# Patient Record
Sex: Male | Born: 1990 | Race: Black or African American | Hispanic: No | Marital: Single | Smoking: Current every day smoker
Health system: Southern US, Community
[De-identification: ages and names within clinical notes are randomized; demographics above are authoritative.]

## PROBLEM LIST (undated history)

## (undated) DIAGNOSIS — J45909 Unspecified asthma, uncomplicated: Secondary | ICD-10-CM

---
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
## 2015-03-10 ENCOUNTER — Emergency Department: Payer: Self-pay | Admitting: Emergency Medicine

## 2016-12-30 ENCOUNTER — Emergency Department: Payer: Self-pay

## 2016-12-30 ENCOUNTER — Emergency Department
Admission: EM | Admit: 2016-12-30 | Discharge: 2016-12-30 | Disposition: A | Discharge status: Home / Self Care | Payer: Self-pay | Attending: Emergency Medicine | Admitting: Emergency Medicine

## 2016-12-30 ENCOUNTER — Encounter: Payer: Self-pay | Admitting: Emergency Medicine

## 2016-12-30 DIAGNOSIS — S46912A Strain of unspecified muscle, fascia and tendon at shoulder and upper arm level, left arm, initial encounter: Secondary | ICD-10-CM | POA: Insufficient documentation

## 2016-12-30 DIAGNOSIS — Y9289 Other specified places as the place of occurrence of the external cause: Secondary | ICD-10-CM | POA: Insufficient documentation

## 2016-12-30 DIAGNOSIS — M7522 Bicipital tendinitis, left shoulder: Secondary | ICD-10-CM | POA: Insufficient documentation

## 2016-12-30 DIAGNOSIS — J45909 Unspecified asthma, uncomplicated: Secondary | ICD-10-CM | POA: Insufficient documentation

## 2016-12-30 DIAGNOSIS — F17210 Nicotine dependence, cigarettes, uncomplicated: Secondary | ICD-10-CM | POA: Insufficient documentation

## 2016-12-30 DIAGNOSIS — W1839XA Other fall on same level, initial encounter: Secondary | ICD-10-CM | POA: Insufficient documentation

## 2016-12-30 DIAGNOSIS — Y9361 Activity, american tackle football: Secondary | ICD-10-CM | POA: Insufficient documentation

## 2016-12-30 DIAGNOSIS — Y999 Unspecified external cause status: Secondary | ICD-10-CM | POA: Insufficient documentation

## 2016-12-30 HISTORY — DX: Unspecified asthma, uncomplicated: J45.909

## 2016-12-30 MED ORDER — CYCLOBENZAPRINE HCL 5 MG PO TABS: 5.0000 mg | ORAL_TABLET | Freq: Three times a day (TID) | ORAL | 0 refills | Status: DC | PRN

## 2016-12-30 NOTE — Discharge Instructions (Signed)
Your exam and x-ray are negative for any fracture or dislocation. You have strained your shoulder due to your football injury. Apply ice and take the prescription muscle relaxant along with your OTC Aleve. Monitor your activities and follow-up with Dr. Joice LoftsPoggi for continued pain and weakness.

## 2016-12-30 NOTE — ED Triage Notes (Signed)
Pt reports he was playing football yesterday when he was tackled and fell on his left shoulder. Decreased ROM and pain to left shoulder.

## 2016-12-30 NOTE — ED Notes (Signed)
See triage note  States he developed pain to left shoulder yesterday  Min swelling noted at clavicle area   Increased pain with ROM

## 2016-12-30 NOTE — ED Provider Notes (Signed)
[facility] Emergency Department Provider Note ____________________________________________  Time seen: 1023  I have reviewed the triage vital signs and the nursing notes.  HISTORY  Chief Complaint  Shoulder Pain  HPI Nathan Sanford is a 26 y.o. male visits to the ED for evaluation of left shoulder pain since yesterday. 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 He describes taken a helmet to the anterior left shoulder as he was tackled to the ground. He reports pain to the shoulder anteriorly but continued to play the game. After that he was over he noted increased pain, and stiffness to the left shoulder. He denies any other injury at this time. He has been without any distal paresthesias, grip changes, or swelling. He reports tightness and pain with range of motion. He denies any recent history of shoulder problems. He gives a remote history of a possible subluxation due to a basketball injury several years prior. No surgeries or chronic and ongoing shoulder pain are reported.He dosed a tablet of Naprosyn yesterday for pain relief.  Past Medical History:  Diagnosis Date  . Asthma     There are no active problems to display for this patient.   History reviewed. No pertinent surgical history.  Prior to Admission medications   Medication Sig Start Date End Date Taking? Authorizing Provider  cyclobenzaprine (FLEXERIL) 5 MG tablet Take 1 tablet (5 mg total) by mouth 3 (three) times daily as needed for muscle spasms. 12/30/16   Charlesetta IvoryJenise V Bacon Brown Dunlap, PA-C    Allergies Patient has no known allergies.  History reviewed. No pertinent family history.  Social History Social History  Substance Use Topics  . Smoking status: Current Every Day Smoker    Packs/day: 0.25    Types: Cigarettes  . Smokeless tobacco: Never Used  . Alcohol use No    Review of Systems  Constitutional: Negative for fever. Cardiovascular:  Negative for chest pain. Respiratory: Negative for shortness of breath. Musculoskeletal: Negative for back pain. Shoulder pain as above. Skin: Negative for rash. Neurological: Negative for headaches, focal weakness or numbness. ____________________________________________  PHYSICAL EXAM:  VITAL SIGNS: ED Triage Vitals [12/30/16 1006]  Enc Vitals Group     BP 129/80     Pulse Rate (!) 56     Resp 18     Temp 97.8 F (36.6 C)     Temp Source Oral     SpO2 99 %     Weight 140 lb (63.5 kg)     Height 5\' 6"  (1.676 m)     Head Circumference      Peak Flow      Pain Score 9     Pain Loc      Pain Edu?      Excl. in GC?    Constitutional: Alert and oriented. Well appearing and in no distress. Head: Normocephalic and atraumatic. Cardiovascular: Normal rate, regular rhythm. Normal distal pulses. Respiratory: Normal respiratory effort. No wheezes/rales/rhonchi. Gastrointestinal: Soft and nontender. No distention. Musculoskeletal: Left shoulder without any obvious deformity, sulcus high, dislocation. Patient with normal active range of motion to the left shoulder. He is able to demonstrate normal internal and external rotation without crepitus or comfort. Normal rotator cuff testing without laxity. Nontender with normal range of motion in all extremities.  Neurologic: Cranial nerves II through XII grossly intact. Normal intrinsic and opposition testing. Normal gait without ataxia. Normal speech and language. No gross focal neurologic deficits are appreciated. Skin:  Skin is warm, dry and intact. No rash noted. ____________________________________________   RADIOLOGY  Left Shoulder IMPRESSION: No acute abnormality.  I, Lillah Standre, Charlesetta Ivory, personally viewed and evaluated these images (plain radiographs) as part of my medical decision making, as well as reviewing the written report by the  radiologist. ____________________________________________  PROCEDURES  sling ____________________________________________  INITIAL IMPRESSION / ASSESSMENT AND PLAN / ED COURSE  Patient with what appears to be a left shoulder strain and potential biceps tendinitis secondary to contusion to the anterior shoulder. No clinical radiologic evidence of acute fracture or dislocation. He'll be discharged with instructions to manage shoulder sprain with ice, anti-inflammatory, and hypertension muscle relaxant. He will follow-up with Dr. Joice Lofts for ongoing symptom management. He is released at this time with activities as tolerated.  Clinical Course    ____________________________________________  FINAL CLINICAL IMPRESSION(S) / ED DIAGNOSES  Final diagnoses:  Strain of left shoulder, initial encounter  Biceps tendinitis of left upper extremity      Lissa Hoard, PA-C 12/30/16 1220    Phineas Semen, MD 12/31/16 1106

## 2017-03-22 ENCOUNTER — Emergency Department: Payer: Self-pay

## 2017-03-22 ENCOUNTER — Encounter: Payer: Self-pay | Admitting: Emergency Medicine

## 2017-03-22 ENCOUNTER — Emergency Department
Admission: EM | Admit: 2017-03-22 | Discharge: 2017-03-22 | Disposition: A | Discharge status: Home / Self Care | Payer: Self-pay | Attending: Emergency Medicine | Admitting: Emergency Medicine

## 2017-03-22 DIAGNOSIS — Y999 Unspecified external cause status: Secondary | ICD-10-CM | POA: Insufficient documentation

## 2017-03-22 DIAGNOSIS — L03032 Cellulitis of left toe: Secondary | ICD-10-CM | POA: Insufficient documentation

## 2017-03-22 DIAGNOSIS — F17210 Nicotine dependence, cigarettes, uncomplicated: Secondary | ICD-10-CM | POA: Insufficient documentation

## 2017-03-22 DIAGNOSIS — J45909 Unspecified asthma, uncomplicated: Secondary | ICD-10-CM | POA: Insufficient documentation

## 2017-03-22 DIAGNOSIS — S60221A Contusion of right hand, initial encounter: Secondary | ICD-10-CM | POA: Insufficient documentation

## 2017-03-22 DIAGNOSIS — W228XXA Striking against or struck by other objects, initial encounter: Secondary | ICD-10-CM | POA: Insufficient documentation

## 2017-03-22 DIAGNOSIS — Y929 Unspecified place or not applicable: Secondary | ICD-10-CM | POA: Insufficient documentation

## 2017-03-22 DIAGNOSIS — Y939 Activity, unspecified: Secondary | ICD-10-CM | POA: Insufficient documentation

## 2017-03-22 MED ORDER — SULFAMETHOXAZOLE-TRIMETHOPRIM 800-160 MG PO TABS: 1.0000 | ORAL_TABLET | Freq: Two times a day (BID) | ORAL | 0 refills | Status: DC

## 2017-03-22 MED ORDER — SULFAMETHOXAZOLE-TRIMETHOPRIM 800-160 MG PO TABS
1.0000 | ORAL_TABLET | Freq: Once | ORAL | Status: AC
  Administered 2017-03-22: 1 via ORAL
  Filled 2017-03-22: qty 1

## 2017-03-22 MED ORDER — TRAMADOL HCL 50 MG PO TABS
50.0000 mg | ORAL_TABLET | Freq: Once | ORAL | Status: AC
  Administered 2017-03-22: 50 mg via ORAL
  Filled 2017-03-22: qty 1

## 2017-03-22 MED ORDER — IBUPROFEN 600 MG PO TABS: 600.0000 mg | ORAL_TABLET | Freq: Four times a day (QID) | ORAL | 0 refills | Status: DC | PRN

## 2017-03-22 MED ORDER — IBUPROFEN 600 MG PO TABS
600.0000 mg | ORAL_TABLET | Freq: Once | ORAL | Status: AC
  Administered 2017-03-22: 600 mg via ORAL
  Filled 2017-03-22: qty 1

## 2017-03-22 MED ORDER — TRAMADOL HCL 50 MG PO TABS: 50.0000 mg | ORAL_TABLET | Freq: Four times a day (QID) | ORAL | 0 refills | Status: DC | PRN

## 2017-03-22 NOTE — ED Notes (Signed)
Pt given snack and water

## 2017-03-22 NOTE — Discharge Instructions (Signed)
Soak your foot in warm Epsom Salt water 4 times per day. Follow up with the primary care provider of your choice for symptoms that are not improving over the next few days. Return to the ER for symptoms that change or worsen if unable to schedule an appointment.

## 2017-03-22 NOTE — ED Provider Notes (Signed)
[facility] Emergency Department Provider Note ____________________________________________  Time seen: Approximately 6:24 PM  I have reviewed the triage vital signs and the nursing notes.   HISTORY  Chief Complaint Hand Pain and Toe Pain    HPI TAKEO HARTS is a 26 y.o. male who presents to the emergency department for evaluation of right hand pain after punching a door yesterday and pain in the left great toe with redness and swelling for the past week.  Past Medical History:  Diagnosis Date  . Asthma     There are no active problems to display for this patient.   History reviewed. No pertinent surgical history.  Prior to Admission medications   Medication Sig Start Date End Date Taking? Authorizing Provider  cyclobenzaprine (FLEXERIL) 5 MG tablet Take 1 tablet (5 mg total) by mouth 3 (three) times daily as needed for muscle spasms. 12/30/16   Jenise V Bacon Menshew, PA-C  ibuprofen (ADVIL,MOTRIN) 600 MG tablet Take 1 tablet (600 mg total) by mouth every 6 (six) hours as needed. 03/22/17   Chinita Pester, FNP  sulfamethoxazole-trimethoprim (BACTRIM DS,SEPTRA DS) 800-160 MG tablet Take 1 tablet by mouth 2 (two) times daily. 03/22/17   Chinita Pester, FNP  traMADol (ULTRAM) 50 MG tablet Take 1 tablet (50 mg total) by mouth every 6 (six) hours as needed. 03/22/17   Chinita Pester, FNP    Allergies Patient has no known allergies.  No family history on file.  Social History Social History  Substance Use Topics  . Smoking status: Current Every Day Smoker    Packs/day: 0.25    Types: Cigarettes  . Smokeless tobacco: Never Used  . Alcohol use No    Review of Systems Constitutional: No recent illness. Cardiovascular: Denies chest pain or palpitations. Respiratory: Denies shortness of breath. Musculoskeletal: Pain in right hand and left great toe. Skin: Negative for rash, wound, lesion. Neurological: Negative for focal weakness or  numbness.  ____________________________________________   PHYSICAL EXAM:  VITAL SIGNS: ED Triage Vitals  Enc Vitals Group     BP 03/22/17 1800 137/70     Pulse Rate 03/22/17 1800 63     Resp 03/22/17 1800 20     Temp 03/22/17 1800 98.5 F (36.9 C)     Temp Source 03/22/17 1800 Oral     SpO2 03/22/17 1800 100 %     Weight 03/22/17 1801 150 lb (68 kg)     Height 03/22/17 1801  (1.702 m)     Head Circumference --      Peak Flow --      Pain Score 03/22/17 1800 10     Pain Loc --      Pain Edu? --      Excl. in GC? --     Constitutional: Alert and oriented. Well appearing and in no acute distress. Eyes: Conjunctivae are normal. EOMI. Head: Atraumatic. Neck: No stridor.  Respiratory: Normal respiratory effort.   Musculoskeletal: Tenderness over the distal 5th metacarpal of the right hand without deformity.  Neurologic:  Normal speech and language. No gross focal neurologic deficits are appreciated. Speech is normal. No gait instability. Skin:  Purulent drainage and fluctuant area at the base of the cuticle on the left great toe. Psychiatric: Mood and affect are normal. Speech and behavior are normal.  ____________________________________________   LABS (all labs ordered are listed, but only abnormal results are displayed)  Labs Reviewed - No data to display ____________________________________________  RADIOLOGY  Right  hand negative for acute bony abnormality per radiologKem Boroughs Shalana Jardin, personally viewed and evaluated these images (plain radiographs) as part of my medical decision making, as well as reviewing the written report by the radiologist.  ___________________________________________   PROCEDURES  Procedure(s) performed: None  ____________________________________________   INITIAL IMPRESSION / ASSESSMENT AND PLAN / ED COURSE  26 year old male presenting to the emergency department for evaluation of right hand pain after punching a wall and  left great toe pain for the past week. Both x-ray and clinical exam of the right hand negative for acute fracture. Left great toe paronychia is spontaneously draining purulent fluid. Patient will be advised to apply ice to his hand. He will be advised to soak his left foot in Epsom salt water. He was advised to follow-up with the primary care provider of his choice for symptoms that are not improving over the next few days. He'll be given prescriptions for tramadol and ibuprofen.  Pertinent labs & imaging results that were available during my care of the patient were reviewed by me and considered in my medical decision making (see chart for details).  _________________________________________   FINAL CLINICAL IMPRESSION(S) / ED DIAGNOSES  Final diagnoses:  Paronychia of great toe, left  Contusion of right hand, initial encounter    New Prescriptions   IBUPROFEN (ADVIL,MOTRIN) 600 MG TABLET    Take 1 tablet (600 mg total) by mouth every 6 (six) hours as needed.   SULFAMETHOXAZOLE-TRIMETHOPRIM (BACTRIM DS,SEPTRA DS) 800-160 MG TABLET    Take 1 tablet by mouth 2 (two) times daily.   TRAMADOL (ULTRAM) 50 MG TABLET    Take 1 tablet (50 mg total) by mouth every 6 (six) hours as needed.    If controlled substance prescribed during this visit, 12 month history viewed on the [facility] prior to issuing an initial prescription for Schedule II or III opiod.    Chinita Pester, FNP 03/22/17 2047    Minna Antis, MD 03/22/17 2229

## 2017-03-22 NOTE — ED Triage Notes (Signed)
Punched door yesterday, pain R hand. Pain great toe L foot with redness and swelling x 1 week.

## 2018-11-26 ENCOUNTER — Emergency Department: Payer: Self-pay

## 2018-11-26 ENCOUNTER — Other Ambulatory Visit: Payer: Self-pay

## 2018-11-26 ENCOUNTER — Emergency Department
Admission: EM | Admit: 2018-11-26 | Discharge: 2018-11-26 | Disposition: A | Discharge status: Home / Self Care | Payer: Self-pay | Attending: Emergency Medicine | Admitting: Emergency Medicine

## 2018-11-26 ENCOUNTER — Encounter: Payer: Self-pay | Admitting: Emergency Medicine

## 2018-11-26 DIAGNOSIS — M25512 Pain in left shoulder: Secondary | ICD-10-CM | POA: Insufficient documentation

## 2018-11-26 DIAGNOSIS — Y990 Civilian activity done for income or pay: Secondary | ICD-10-CM | POA: Insufficient documentation

## 2018-11-26 DIAGNOSIS — J45909 Unspecified asthma, uncomplicated: Secondary | ICD-10-CM | POA: Insufficient documentation

## 2018-11-26 DIAGNOSIS — X500XXA Overexertion from strenuous movement or load, initial encounter: Secondary | ICD-10-CM | POA: Insufficient documentation

## 2018-11-26 DIAGNOSIS — Y9389 Activity, other specified: Secondary | ICD-10-CM | POA: Insufficient documentation

## 2018-11-26 DIAGNOSIS — F17210 Nicotine dependence, cigarettes, uncomplicated: Secondary | ICD-10-CM | POA: Insufficient documentation

## 2018-11-26 DIAGNOSIS — Y92513 Shop (commercial) as the place of occurrence of the external cause: Secondary | ICD-10-CM | POA: Insufficient documentation

## 2018-11-26 NOTE — ED Triage Notes (Signed)
Patient to ER for c/o left shoulder pain. 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 States it has been working since starting there approx one week ago. States pain has worsened since then.

## 2018-11-26 NOTE — ED Provider Notes (Signed)
[facility] Emergency Department Provider Note  Time seen: 9:39 PM  I have reviewed the triage vital signs and the nursing notes.   HISTORY  Chief Complaint Shoulder Injury    HPI Nathan Sanford is a 27 y.o. male with a past medical history of asthma who presents to the emergency department for left shoulder pain.  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  Patient states a history of a prior right shoulder dislocation which felt similar.  However the patient states this time he is able to move his left shoulder but it hurts to do so.  Denies any falls or trauma.  No numbness or tingling.   Past Medical History:  Diagnosis Date  . Asthma     There are no active problems to display for this patient.   History reviewed. No pertinent surgical history.  Prior to Admission medications   Medication Sig Start Date End Date Taking? Authorizing Provider  cyclobenzaprine (FLEXERIL) 5 MG tablet Take 1 tablet (5 mg total) by mouth 3 (three) times daily as needed for muscle spasms. 12/30/16   Menshew, Charlesetta IvoryJenise V Bacon, PA-C  ibuprofen (ADVIL,MOTRIN) 600 MG tablet Take 1 tablet (600 mg total) by mouth every 6 (six) hours as needed. 03/22/17   Triplett, Cari B, FNP  sulfamethoxazole-trimethoprim (BACTRIM DS,SEPTRA DS) 800-160 MG tablet Take 1 tablet by mouth 2 (two) times daily. 03/22/17   Triplett, Cari B, FNP  traMADol (ULTRAM) 50 MG tablet Take 1 tablet (50 mg total) by mouth every 6 (six) hours as needed. 03/22/17   Chinita Pesterriplett, Cari B, FNP    Allergies  Allergen Reactions  . Other     "All fruits": itching in mouth    No family history on file.  Social History Social History   Tobacco Use  . Smoking status: Current Every Day Smoker    Packs/day: 0.25    Types: Cigarettes  . Smokeless tobacco: Never Used  Substance Use Topics  . Alcohol use: No  . Drug use: Not on file    Review  of Systems Constitutional: Negative for fever. Cardiovascular: Negative for chest pain. Respiratory: Negative for shortness of breath. Gastrointestinal: Negative for abdominal pain Genitourinary: Negative for urinary compaints Musculoskeletal: Left shoulder pain, moderate, dull pain worse with movement. Skin: Negative for skin complaints  Neurological: Negative for headache All other ROS negative  ____________________________________________   PHYSICAL EXAM:  VITAL SIGNS: ED Triage Vitals  Enc Vitals Group     BP 11/26/18 2001 128/82     Pulse Rate 11/26/18 2001 (!) 49     Resp 11/26/18 2001 20     Temp 11/26/18 2001 98.4 F (36.9 C)     Temp Source 11/26/18 2001 Oral     SpO2 11/26/18 2001 99 %     Weight 11/26/18 2002 140 lb (63.5 kg)     Height 11/26/18 2002 5\' 7"  (1.702 m)     Head Circumference --      Peak Flow --      Pain Score 11/26/18 2001 9     Pain Loc --      Pain Edu? --      Excl. in GC? --     Constitutional: Alert and oriented. Well appearing and in no distress. Eyes: Normal exam ENT   Head: Normocephalic and atraumatic.   Mouth/Throat: Mucous membranes are moist. Cardiovascular: Normal rate, regular rhythm. No  murmur Respiratory: Normal respiratory effort without tachypnea nor retractions. Breath sounds are clear  Gastrointestinal: Soft and nontender. No distention.   Musculoskeletal: Mild tenderness palpation left shoulder, good range of motion.  Neurovascularly intact.  Moderate pain with range of motion. Neurologic:  Normal speech and language. No gross focal neurologic deficits Skin:  Skin is warm, dry and intact.  Psychiatric: Mood and affect are normal.      RADIOLOGY  X-rays negative  ____________________________________________   INITIAL IMPRESSION / ASSESSMENT AND PLAN / ED COURSE  Pertinent labs & imaging results that were available during my care of the patient were reviewed by me and considered in my medical decision  making (see chart for details).  Patient presents emergency department for left shoulder pain since unloading a truck at work.  Patient has mild tenderness, moderate pain with range of motion although good range of motion, will obtain an x-ray as a precaution.  Patient agreeable to plan of care.  X-rays negative we will discharge from the emergency department with PCP follow-up. ____________________________________________   FINAL CLINICAL IMPRESSION(S) / ED DIAGNOSES  Left shoulder pain    Minna Antis, MD 11/26/18 2155

## 2018-11-26 NOTE — ED Notes (Signed)
Pt ambulatory to POV with family. VSS. NAD. Discharge instructions and follow up reviewed. All questions and concerns addressed.

## 2019-03-19 ENCOUNTER — Emergency Department
Admission: EM | Admit: 2019-03-19 | Discharge: 2019-03-19 | Disposition: A | Discharge status: Home / Self Care | Payer: Self-pay | Attending: Emergency Medicine | Admitting: Emergency Medicine

## 2019-03-19 DIAGNOSIS — F17210 Nicotine dependence, cigarettes, uncomplicated: Secondary | ICD-10-CM | POA: Insufficient documentation

## 2019-03-19 DIAGNOSIS — J069 Acute upper respiratory infection, unspecified: Secondary | ICD-10-CM | POA: Insufficient documentation

## 2019-03-19 DIAGNOSIS — J45909 Unspecified asthma, uncomplicated: Secondary | ICD-10-CM | POA: Insufficient documentation

## 2019-03-19 DIAGNOSIS — R0981 Nasal congestion: Secondary | ICD-10-CM | POA: Insufficient documentation

## 2019-03-19 NOTE — ED Triage Notes (Addendum)
Pt presents to ED via POV with c/o nasal drainage x several days. Pt states hx of seasonal allergies, c/o clear nasal drainage at this time. Pt also c/o cough since last night. Pt A&O x4. NAD noted at this time.

## 2019-03-19 NOTE — ED Notes (Signed)
D/C instructions and self-quarantine instructions reviewed with patient. Pt states understanding. Ambulatory in NAD at this time.

## 2019-03-19 NOTE — ED Provider Notes (Signed)
[facility] Emergency Department Provider Note  ____________________________________________   None    (approximate)  I have reviewed the triage vital signs and the nursing notes.   HISTORY  Chief Complaint Nasal Congestion    HPI Nathan Sanford is a 28 y.o. male presents to the emergency department with URI symptoms.  Is complaining of cough, congestion, denies fever, denies chills, denies chest pain, denies shortness of breath Denies recent travel, eyes close contact with Covid 19+ patient,    Past Medical History:  Diagnosis Date  . Asthma     There are no active problems to display for this patient.   History reviewed. No pertinent surgical history.  Prior to Admission medications   Medication Sig Start Date End Date Taking? Authorizing Provider  cyclobenzaprine (FLEXERIL) 5 MG tablet Take 1 tablet (5 mg total) by mouth 3 (three) times daily as needed for muscle spasms. 12/30/16   Menshew, Charlesetta Ivory, PA-C  ibuprofen (ADVIL,MOTRIN) 600 MG tablet Take 1 tablet (600 mg total) by mouth every 6 (six) hours as needed. 03/22/17   Triplett, Cari B, FNP  sulfamethoxazole-trimethoprim (BACTRIM DS,SEPTRA DS) 800-160 MG tablet Take 1 tablet by mouth 2 (two) times daily. 03/22/17   Triplett, Cari B, FNP  traMADol (ULTRAM) 50 MG tablet Take 1 tablet (50 mg total) by mouth every 6 (six) hours as needed. 03/22/17   Chinita Pester, FNP    Allergies Other  No family history on file.  Social History Social History   Tobacco Use  . Smoking status: Current Every Day Smoker    Packs/day: 0.25    Types: Cigarettes  . Smokeless tobacco: Never Used  Substance Use Topics  . Alcohol use: No  . Drug use: Not on file    Review of Systems  Constitutional: Denies fever/chills Eyes: No visual changes. ENT: Denies sore throat.  He is complaining of nasal drainage and seasonal allergies Respiratory: Complains of cough, but denies shortness of breath  Genitourinary: Negative for dysuria. Musculoskeletal: Negative for back pain. Skin: Negative for rash.    ____________________________________________   PHYSICAL EXAM:  VITAL SIGNS: ED Triage Vitals [03/19/19 1329]  Enc Vitals Group     BP (!) 124/91     Pulse Rate (!) 55     Resp 18     Temp 98.4 F (36.9 C)     Temp Source Oral     SpO2 100 %     Weight 140 lb (63.5 kg)     Height 5\' 7"  (1.702 m)     Head Circumference      Peak Flow      Pain Score 0     Pain Loc      Pain Edu?      Excl. in GC?     Constitutional: Alert and oriented. Well appearing and in no acute distress. Eyes: Conjunctivae are normal.  Head: Atraumatic. Nose: No congestion/rhinnorhea. Mouth/Throat: Mucous membranes are moist.   Neck:  supple no lymphadenopathy noted Cardiovascular: Normal rate, regular rhythm. Heart sounds are normal Respiratory: Normal respiratory effort.  No retractions, lungs clear to auscultation GU: deferred Musculoskeletal: FROM all extremities, warm and well perfused Neurologic:  Normal speech and language.  Skin:  Skin is warm, dry and intact. No rash noted. Psychiatric: Mood and affect are normal. Speech and behavior are normal.  ____________________________________________   LABS (all labs ordered are listed, but only abnormal results are displayed)  Labs Reviewed - No data to display  ____________________________________________   ____________________________________________  RADIOLOGY    ____________________________________________   PROCEDURES  Procedure(s) performed: No  Procedures    ____________________________________________   INITIAL IMPRESSION / ASSESSMENT AND PLAN / ED COURSE  Pertinent labs & imaging results that were available during my care of the patient were reviewed by me and considered in my medical decision making (see chart for details).   Patient is a 28 year old male who complains of URI symptoms.  Basic medical exam  was performed. The patient was instructed to quarantine themselves at home.  Follow-up with your regular doctor if any concerns.  Return emergency department for worsening.     Patient's vital signs are stable.  This patient currently does not meet criteria for testing and I explained that in detail to the patient.  The evaluation today is reassuring with no evidence of emergent medical condition that requires further work-up or evaluation or inpatient treatment.  I provided follow-up recommendations and strict return precautions.  The patient understands and agrees with the plan.  As part of my medical decision making, I reviewed the following data within the electronic MEDICAL RECORD NUMBER Nursing notes reviewed and incorporated, Old chart reviewed, Notes from prior ED visits and [place] Controlled Substance Database  ____________________________________________   FINAL CLINICAL IMPRESSION(S) / ED DIAGNOSES  Final diagnoses:  Acute URI      NEW MEDICATIONS STARTED DURING THIS VISIT:  Discharge Medication List as of 03/19/2019  1:58 PM       Note:  This document was prepared using Dragon voice recognition software and may include unintentional dictation errors.    Faythe Ghee, PA-C 03/19/19 1446    Dionne Bucy, MD 03/19/19 2117

## 2019-08-14 ENCOUNTER — Encounter: Payer: Self-pay | Admitting: Emergency Medicine

## 2019-08-14 ENCOUNTER — Emergency Department
Admission: EM | Admit: 2019-08-14 | Discharge: 2019-08-14 | Disposition: A | Discharge status: Home / Self Care | Payer: Self-pay | Attending: Emergency Medicine | Admitting: Emergency Medicine

## 2019-08-14 ENCOUNTER — Emergency Department: Payer: Self-pay

## 2019-08-14 ENCOUNTER — Other Ambulatory Visit: Payer: Self-pay

## 2019-08-14 DIAGNOSIS — X500XXA Overexertion from strenuous movement or load, initial encounter: Secondary | ICD-10-CM | POA: Insufficient documentation

## 2019-08-14 DIAGNOSIS — F17210 Nicotine dependence, cigarettes, uncomplicated: Secondary | ICD-10-CM | POA: Insufficient documentation

## 2019-08-14 DIAGNOSIS — Y999 Unspecified external cause status: Secondary | ICD-10-CM | POA: Insufficient documentation

## 2019-08-14 DIAGNOSIS — Y9389 Activity, other specified: Secondary | ICD-10-CM | POA: Insufficient documentation

## 2019-08-14 DIAGNOSIS — Y929 Unspecified place or not applicable: Secondary | ICD-10-CM | POA: Insufficient documentation

## 2019-08-14 DIAGNOSIS — S29019A Strain of muscle and tendon of unspecified wall of thorax, initial encounter: Secondary | ICD-10-CM | POA: Insufficient documentation

## 2019-08-14 DIAGNOSIS — M62838 Other muscle spasm: Secondary | ICD-10-CM | POA: Insufficient documentation

## 2019-08-14 DIAGNOSIS — J45909 Unspecified asthma, uncomplicated: Secondary | ICD-10-CM | POA: Insufficient documentation

## 2019-08-14 DIAGNOSIS — Z79899 Other long term (current) drug therapy: Secondary | ICD-10-CM | POA: Insufficient documentation

## 2019-08-14 DIAGNOSIS — T148XXA Other injury of unspecified body region, initial encounter: Secondary | ICD-10-CM

## 2019-08-14 MED ORDER — MELOXICAM 15 MG PO TABS: 15.0000 mg | ORAL_TABLET | Freq: Every day | ORAL | 1 refills | Status: AC

## 2019-08-14 MED ORDER — METHOCARBAMOL 500 MG PO TABS: 500.0000 mg | ORAL_TABLET | Freq: Three times a day (TID) | ORAL | 0 refills | Status: AC | PRN

## 2019-08-14 NOTE — Discharge Instructions (Signed)
Please take meloxicam daily for inflammation. Robaxin can be taken at night before bedtime.  Please do not drive with Robaxin in your system. You can use ice and heat alternating to help with pain. Return to the emergency department with new or worsening symptoms.

## 2019-08-14 NOTE — ED Provider Notes (Signed)
[facility] Emergency Department Provider Note  ____________________________________________  Time seen: Approximately 3:33 PM  I have reviewed the triage vital signs and the nursing notes.   HISTORY  Chief Complaint Shoulder Pain    HPI Nathan Sanford is a 28 y.o. male presents to the emergency department with pain along the right latissimus dorsi and along the right lateral pectoralis major.  Patient states that he has experienced pain for the past 2 days since lifting a heavy bag of fertilizer.  He denies weakness in the arms or legs.  No numbness or tingling in the upper extremities.  He denies chest pain, chest tightness or shortness of breath.  He has not tried any medications at home.        Past Medical History:  Diagnosis Date  . Asthma     There are no active problems to display for this patient.   History reviewed. No pertinent surgical history.  Prior to Admission medications   Medication Sig Start Date End Date Taking? Authorizing Provider  cyclobenzaprine (FLEXERIL) 5 MG tablet Take 1 tablet (5 mg total) by mouth 3 (three) times daily as needed for muscle spasms. 12/30/16   Menshew, Charlesetta IvoryJenise V Bacon, PA-C  ibuprofen (ADVIL,MOTRIN) 600 MG tablet Take 1 tablet (600 mg total) by mouth every 6 (six) hours as needed. 03/22/17   Triplett, Cari B, FNP  sulfamethoxazole-trimethoprim (BACTRIM DS,SEPTRA DS) 800-160 MG tablet Take 1 tablet by mouth 2 (two) times daily. 03/22/17   Triplett, Cari B, FNP  traMADol (ULTRAM) 50 MG tablet Take 1 tablet (50 mg total) by mouth every 6 (six) hours as needed. 03/22/17   Chinita Pesterriplett, Cari B, FNP    Allergies Other  No family history on file.  Social History Social History   Tobacco Use  . Smoking status: Current Every Day Smoker    Packs/day: 0.25    Types: Cigarettes  . Smokeless tobacco: Never Used  Substance Use Topics  . Alcohol use: No  . Drug use: Not on file     Review of Systems   Constitutional: No fever/chills Eyes: No visual changes. No discharge ENT: No upper respiratory complaints. Cardiovascular: no chest pain. Respiratory: no cough. No SOB. Gastrointestinal: No abdominal pain.  No nausea, no vomiting.  No diarrhea.  No constipation. Genitourinary: Negative for dysuria. No hematuria Musculoskeletal: Patient has right-sided latissimus dorsi and pectoralis major pain. Skin: Negative for rash, abrasions, lacerations, ecchymosis. Neurological: Negative for headaches, focal weakness or numbness.   ____________________________________________   PHYSICAL EXAM:  VITAL SIGNS: ED Triage Vitals  Enc Vitals Group     BP 08/14/19 1428 121/77     Pulse Rate 08/14/19 1428 62     Resp 08/14/19 1428 18     Temp 08/14/19 1428 98 F (36.7 C)     Temp Source 08/14/19 1428 Oral     SpO2 08/14/19 1428 99 %     Weight 08/14/19 1429 141 lb 1.5 oz (64 kg)     Height 08/14/19 1429 5\' 7"  (1.702 m)     Head Circumference --      Peak Flow --      Pain Score 08/14/19 1429 10     Pain Loc --      Pain Edu? --      Excl. in [place]? --      Constitutional: Alert and oriented. Well appearing and in no acute distress. Eyes: Conjunctivae are normal. PERRL. EOMI. Head: Atraumatic. Cardiovascular: Normal rate, regular rhythm. Normal  S1 and S2.  Good peripheral circulation. Respiratory: Normal respiratory effort without tachypnea or retractions. Lungs CTAB. Good air entry to the bases with no decreased or absent breath sounds. Musculoskeletal: Patient is able to spread his right fingers and can move all 5 right digits.  He can perform flexion at the right IP joint.  He can perform pronation and supination without difficulty.  He performs full range of motion at the right wrist, right elbow and right shoulder.  He has no tenderness to palpation of the right AC joint.  No right rotator cuff weakness was appreciated with testing.  He had reproducible tenderness with palpation over the  right lateral pectoralis major and the right latissimus dorsi.  Palpable radial pulse, right.  Capillary refill less than 2 seconds right. Neurologic:  Normal speech and language. No gross focal neurologic deficits are appreciated.  Skin:  Skin is warm, dry and intact. No rash noted. Psychiatric: Mood and affect are normal. Speech and behavior are normal. Patient exhibits appropriate insight and judgement.   ____________________________________________   LABS (all labs ordered are listed, but only abnormal results are displayed)  Labs Reviewed - No data to display ____________________________________________  EKG   ____________________________________________  RADIOLOGY I personally viewed and evaluated these images as part of my medical decision making, as well as reviewing the written report by the radiologist.  Dg Shoulder Right  Result Date: 08/14/2019 CLINICAL DATA:  Shoulder pain for several hours following heavy lifting, initial encounter EXAM: RIGHT SHOULDER - 2+ VIEW COMPARISON:  None. FINDINGS: There is no evidence of fracture or dislocation. There is no evidence of arthropathy or other focal bone abnormality. Soft tissues are unremarkable. IMPRESSION: No acute abnormality noted. Electronically Signed   By: Inez Catalina M.D.   On: 08/14/2019 14:57    ____________________________________________    PROCEDURES  Procedure(s) performed:    Procedures    Medications - No data to display   ____________________________________________   INITIAL IMPRESSION / ASSESSMENT AND PLAN / ED COURSE  Pertinent labs & imaging results that were available during my care of the patient were reviewed by me and considered in my medical decision making (see chart for details).  Review of the [place] CSRS was performed in accordance of the [place] prior to dispensing any controlled drugs.           Assessment and plan Muscle strain 28 year old male presents to the emergency  department with pain in the distribution of the right latissimus dorsi and the right pectoralis major.  Vital signs were reassuring at triage.  Patient had reproducible tenderness with palpation over the right latissimus dorsi and the right pectoralis major consistent with muscle spasm/strain.  Patient was discharged with meloxicam and Robaxin.  He was advised to follow-up with primary care as needed and a work note was provided at his request.  All patient questions were answered.    ____________________________________________  FINAL CLINICAL IMPRESSION(S) / ED DIAGNOSES  Final diagnoses:  Muscle strain      NEW MEDICATIONS STARTED DURING THIS VISIT:  ED Discharge Orders    None          This chart was dictated using voice recognition software/Dragon. Despite best efforts to proofread, errors can occur which can change the meaning. Any change was purely unintentional.    Malaquias, Lenker, PA-C 08/14/19 1538    Nance Pear, MD 08/14/19 [phone]

## 2019-08-14 NOTE — ED Triage Notes (Signed)
Pt arrives with pain to right shoulder. Pt reports lifting heavy yesterday for job.

## 2020-01-13 ENCOUNTER — Encounter: Payer: Self-pay | Admitting: Emergency Medicine

## 2020-01-13 ENCOUNTER — Emergency Department
Admission: EM | Admit: 2020-01-13 | Discharge: 2020-01-13 | Disposition: A | Discharge status: Home / Self Care | Payer: Self-pay | Attending: Emergency Medicine | Admitting: Emergency Medicine

## 2020-01-13 ENCOUNTER — Other Ambulatory Visit: Payer: Self-pay

## 2020-01-13 ENCOUNTER — Emergency Department: Payer: Self-pay

## 2020-01-13 DIAGNOSIS — F17210 Nicotine dependence, cigarettes, uncomplicated: Secondary | ICD-10-CM | POA: Insufficient documentation

## 2020-01-13 DIAGNOSIS — S39012A Strain of muscle, fascia and tendon of lower back, initial encounter: Secondary | ICD-10-CM | POA: Insufficient documentation

## 2020-01-13 DIAGNOSIS — Y939 Activity, unspecified: Secondary | ICD-10-CM | POA: Insufficient documentation

## 2020-01-13 DIAGNOSIS — X58XXXA Exposure to other specified factors, initial encounter: Secondary | ICD-10-CM | POA: Insufficient documentation

## 2020-01-13 DIAGNOSIS — J45909 Unspecified asthma, uncomplicated: Secondary | ICD-10-CM | POA: Insufficient documentation

## 2020-01-13 DIAGNOSIS — Y929 Unspecified place or not applicable: Secondary | ICD-10-CM | POA: Insufficient documentation

## 2020-01-13 DIAGNOSIS — Y999 Unspecified external cause status: Secondary | ICD-10-CM | POA: Insufficient documentation

## 2020-01-13 MED ORDER — DIAZEPAM 5 MG PO TABS: 5.0000 mg | ORAL_TABLET | Freq: Three times a day (TID) | ORAL | 0 refills | Status: DC | PRN

## 2020-01-13 MED ORDER — OXYCODONE-ACETAMINOPHEN 5-325 MG PO TABS
2.0000 | ORAL_TABLET | Freq: Once | ORAL | Status: AC
  Administered 2020-01-13: 2 via ORAL
  Filled 2020-01-13: qty 2

## 2020-01-13 MED ORDER — IBUPROFEN 800 MG PO TABS: 800.0000 mg | ORAL_TABLET | Freq: Three times a day (TID) | ORAL | 0 refills | Status: DC | PRN

## 2020-01-13 NOTE — ED Triage Notes (Signed)
Patient reports lower and mid-back pain since waking up on Sunday.  Patient denies any known strain or injury.  Patient does landscaping work.

## 2020-01-13 NOTE — ED Provider Notes (Signed)
[facility] Emergency Department Provider Note       Time seen: ----------------------------------------- 3:01 PM on 01/13/2020 -----------------------------------------   I have reviewed the triage vital signs and the nursing notes.  HISTORY   Chief Complaint Back Pain    HPI Nathan Sanford is a 29 y.o. male with a history of asthma who presents to the ED for low back pain.  Patient states he could not go to work today because of the low back pain.  Pain started after waking up on Sunday.  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  Discomfort is 8 out of 10.  Past Medical History:  Diagnosis Date  . Asthma     There are no problems to display for this patient.   History reviewed. No pertinent surgical history.  Allergies Other  Social History Social History   Tobacco Use  . Smoking status: Current Every Day Smoker    Packs/day: 0.25    Types: Cigarettes  . Smokeless tobacco: Never Used  Substance Use Topics  . Alcohol use: No  . Drug use: Not on file   Review of Systems Constitutional: Negative for fever. Cardiovascular: Negative for chest pain. Respiratory: Negative for shortness of breath. Gastrointestinal: Negative for abdominal pain Musculoskeletal: Positive for back pain Skin: Negative for rash. Neurological: Negative for headaches, focal weakness or numbness.  All systems negative/normal/unremarkable except as stated in the HPI  ____________________________________________   PHYSICAL EXAM:  VITAL SIGNS: ED Triage Vitals  Enc Vitals Group     BP 01/13/20 1403 124/84     Pulse Rate 01/13/20 1403 (!) 56     Resp 01/13/20 1403 16     Temp 01/13/20 1403 98.6 F (37 C)     Temp Source 01/13/20 1403 Oral     SpO2 01/13/20 1403 100 %     Weight 01/13/20 1404 150 lb (68 kg)     Height 01/13/20 1404 5\' 8"  (1.727 m)     Head Circumference --      Peak Flow --      Pain Score 01/13/20 1404 8     Pain Loc --       Pain Edu? --      Excl. in [place]? --    Constitutional: Alert and oriented. Well appearing and in no distress. Cardiovascular: Normal rate, regular rhythm. No murmurs, rubs, or gallops. Respiratory: Normal respiratory effort without tachypnea nor retractions. Breath sounds are clear and equal bilaterally. No wheezes/rales/rhonchi. Musculoskeletal: Nontender with normal range of motion in extremities.  Nonfocal lumbar spine tenderness Neurologic:  Normal speech and language. No gross focal neurologic deficits are appreciated.  Skin:  Skin is warm, dry and intact. No rash noted. Psychiatric: Mood and affect are normal. Speech and behavior are normal.  ____________________________________________  ED COURSE:  As part of my medical decision making, I reviewed the following data within the [place] History obtained from family if available, nursing notes, old chart and ekg, as well as notes from prior ED visits. Patient presented for low back pain, we will assess with labs and imaging as indicated at this time.   Procedures  MYKEL SPONAUGLE was evaluated in Emergency Department on 01/13/2020 for the symptoms described in the history of present illness. He was evaluated in the context of the global COVID-19 pandemic, which necessitated consideration that the patient might be at risk for infection with the SARS-CoV-2 virus that causes COVID-19. Institutional protocols and algorithms that pertain to  the evaluation of patients at risk for COVID-19 are in a state of rapid change based on information released by regulatory bodies including the CDC and federal and state organizations. These policies and algorithms were followed during the patient's care in the ED.  ____________________________________________   RADIOLOGY Images were viewed by me  Lumbar spine x-ray Did not reveal any acute process ____________________________________________   DIFFERENTIAL DIAGNOSIS   Muscle  strain, spasm, fracture  FINAL ASSESSMENT AND PLAN  Lumbosacral strain   Plan: The patient had presented for low back pain. Patient's imaging was reassuring.  Patient be given NSAIDs and muscle relaxants and is cleared for outpatient follow-up.   Ulice Dash, MD    Note: This note was generated in part or whole with voice recognition software. Voice recognition is usually quite accurate but there are transcription errors that can and very often do occur. I apologize for any typographical errors that were not detected and corrected.     Emily Filbert, MD 01/13/20 1537

## 2020-04-05 ENCOUNTER — Encounter: Payer: Self-pay | Admitting: Emergency Medicine

## 2020-04-05 ENCOUNTER — Other Ambulatory Visit: Payer: Self-pay

## 2020-04-05 ENCOUNTER — Emergency Department
Admission: EM | Admit: 2020-04-05 | Discharge: 2020-04-05 | Disposition: A | Discharge status: Home / Self Care | Payer: Self-pay | Attending: Emergency Medicine | Admitting: Emergency Medicine

## 2020-04-05 DIAGNOSIS — K011 Impacted teeth: Secondary | ICD-10-CM | POA: Insufficient documentation

## 2020-04-05 DIAGNOSIS — K029 Dental caries, unspecified: Secondary | ICD-10-CM | POA: Insufficient documentation

## 2020-04-05 DIAGNOSIS — J45909 Unspecified asthma, uncomplicated: Secondary | ICD-10-CM | POA: Insufficient documentation

## 2020-04-05 DIAGNOSIS — F17210 Nicotine dependence, cigarettes, uncomplicated: Secondary | ICD-10-CM | POA: Insufficient documentation

## 2020-04-05 MED ORDER — AMOXICILLIN-POT CLAVULANATE 875-125 MG PO TABS
1.0000 | ORAL_TABLET | Freq: Once | ORAL | Status: AC
  Administered 2020-04-05: 1 via ORAL
  Filled 2020-04-05: qty 1

## 2020-04-05 MED ORDER — AMOXICILLIN-POT CLAVULANATE 875-125 MG PO TABS: 1.0000 | ORAL_TABLET | Freq: Two times a day (BID) | ORAL | 0 refills | Status: AC

## 2020-04-05 NOTE — ED Provider Notes (Signed)
[facility] Emergency Department Provider Note ____________________________________________  Time seen: 1511  I have reviewed the triage vital signs and the nursing notes.  HISTORY  Chief Complaint  Dental Pain  HPI Nathan Sanford is a 29 y.o. male presents to the ED reporting dental infection to the right mandible.  Patient describes similar symptoms last year where he was evaluated by dental provider.  He was unable at that time to have the bad tooth removed.  He denies any interim fever, chills, or sweats.  He does report to some spontaneous purulent drainage into his mouth.  He also admits to dosing 2 leftover amoxicillin pills from his mother.  He is requesting a different antibiotic citing he has been on amoxicillin multiple times in the past but continues to have recurrence of his abscess.  Advised the patient that is likely not a failure of the antibiotic course, but the ongoing tooth defect that is highly prone to abscess formation.  He presents now with mouth pain and jaw pain.  Patient is controlling his oral secretions and is able to eat and drink without difficulty.  Patient denies any other symptoms at this time.   Past Medical History:  Diagnosis Date  . Asthma     There are no problems to display for this patient.   History reviewed. No pertinent surgical history.  Prior to Admission medications   Medication Sig Start Date End Date Taking? Authorizing Provider  amoxicillin-clavulanate (AUGMENTIN) 875-125 MG tablet Take 1 tablet by mouth 2 (two) times daily for 10 days. 04/05/20 04/15/20  Angeles Zehner, Dannielle Karvonen, PA-C  diazepam (VALIUM) 5 MG tablet Take 1 tablet (5 mg total) by mouth every 8 (eight) hours as needed for muscle spasms. 01/13/20   Earleen Newport, MD    Allergies Other  No family history on file.  Social History Social History   Tobacco Use  . Smoking status: Current Every Day Smoker    Packs/day: 0.25    Types: Cigarettes   . Smokeless tobacco: Never Used  Substance Use Topics  . Alcohol use: No  . Drug use: Not on file    Review of Systems  Constitutional: Negative for fever. Eyes: Negative for visual changes. ENT: Negative for sore throat. Dental pain as above Respiratory: Negative for shortness of breath. Gastrointestinal: Negative for abdominal pain, vomiting and diarrhea. Musculoskeletal: Negative for back pain. Skin: Negative for rash. Neurological: Negative for headaches, focal weakness or numbness. ____________________________________________  PHYSICAL EXAM:  VITAL SIGNS: ED Triage Vitals  Enc Vitals Group     BP 04/05/20 1437 126/73     Pulse Rate 04/05/20 1437 89     Resp 04/05/20 1437 18     Temp 04/05/20 1437 99.1 F (37.3 C)     Temp Source 04/05/20 1437 Oral     SpO2 04/05/20 1437 98 %     Weight 04/05/20 1440 149 lb (67.6 kg)     Height 04/05/20 1440 5\' 7"  (1.702 m)     Head Circumference --      Peak Flow --      Pain Score 04/05/20 1439 9     Pain Loc --      Pain Edu? --      Excl. in [place]? --     Constitutional: Alert and oriented. Well appearing and in no distress. Head: Normocephalic and atraumatic. Eyes: Conjunctivae are normal. PERRL. Normal extraocular movements Mouth/Throat: Mucous membranes are moist.  Uvula is midline and tonsils are  flat.  No oropharyngeal lesions are appreciated.  Patient localizes pain to the right third molar.  The molar is partially erupted but no focal gum swelling is appreciated.  The molar has a large buccal cavity and defect extending into the dentin.  No brawny sublingual edema is noted. Neck: Supple. No thyromegaly. Hematological/Lymphatic/Immunological: No cervical lymphadenopathy. Cardiovascular: Normal rate, regular rhythm. Normal distal pulses. Respiratory: Normal respiratory effort. No wheezes/rales/rhonchi. Musculoskeletal: Nontender with normal range of motion in all extremities.  Neurologic:  Normal gait without ataxia.  Normal speech and language. No gross focal neurologic deficits are appreciated. Skin:  Skin is warm, dry and intact. No rash noted. Psychiatric: Mood and affect are normal. Patient exhibits appropriate insight and judgment. ____________________________________________  PROCEDURES  Augmentin 875-125 mg 1 p.o.  Procedures ____________________________________________  INITIAL IMPRESSION / ASSESSMENT AND PLAN / ED COURSE  Patient with ED evaluation of acute pain to the right mandible secondary to a partially erupted third molar.  Patient reports recurrence of dental infection at the same site.  He is requesting antibiotic coverage and has declined penicillin/amoxicillin at this time.  Patient with no reported drug allergies.  We will treat with Augmentin as well as to refer the patient to a dental provider for definitive management.  He is advised that he should avoid eating and chewing on the side of the affected tooth.  He also should rinse after every meal.  He should also consider up a partial/temporary filling to the area to help prevent infection.  A list of dental providers is provided.  Return precautions have been reviewed.  ERMA JOUBERT was evaluated in Emergency Department on 04/05/2020 for the symptoms described in the history of present illness. He was evaluated in the context of the global COVID-19 pandemic, which necessitated consideration that the patient might be at risk for infection with the SARS-CoV-2 virus that causes COVID-19. Institutional protocols and algorithms that pertain to the evaluation of patients at risk for COVID-19 are in a state of rapid change based on information released by regulatory bodies including the CDC and federal and state organizations. These policies and algorithms were followed during the patient's care in the ED. ____________________________________________  FINAL CLINICAL IMPRESSION(S) / ED DIAGNOSES  Final diagnoses:  Pain due to dental caries   Impacted third molar tooth      Lissa Hoard, PA-C 04/05/20 1535    Dionne Bucy, MD 04/05/20 [phone]

## 2020-04-05 NOTE — ED Triage Notes (Signed)
Pt arrived via POV with reports of dental infection on the right last year, pt states he was seen by a dentist a year ago but unable to remove bad tooth.  Pt c/o mouth and neck pain.

## 2020-04-05 NOTE — ED Notes (Signed)
See triage note  Presents with possible dental abscess  States he has had problem with a tooth to right  States he has had problems with same tooth for about 1 year

## 2020-04-05 NOTE — Discharge Instructions (Signed)
Take the antibiotic as directed. Take all of the pills prescribed. Follow-up with one of the local dental provider for dental care and extraction.   OPTIONS FOR DENTAL FOLLOW UP CARE  [place] [facility] and [facility] - Local Safety Net Dental Clinics [email or web address]   [facility] [phone])  [facility] [phone])  [place] [phone])  [facility] [phone])  [facility] [phone]) This clinic caters to the indigent population and is on a lottery system. Location: [employer] [facility], [employer], [address], [place] Clinic Hours: Wednesdays from 6pm - 9pm, patients seen by a lottery system. For dates, call or go to [email or web address] Services: Cleanings, fillings and simple extractions. Payment Options: DENTAL WORK IS FREE OF CHARGE. Bring proof of income or support. Best way to get seen: Arrive at 5:15 pm - this is a lottery, NOT first come/first serve, so arriving earlier will not increase your chances of being seen.     [facility] [phone] Select option 1 for emergencies   Location: [facility], [place], [address] [place] Clinic Hours: No walk-ins accepted - call the day before to schedule an appointment. Check in times are 9:30 am and 1:30 pm. Services: Simple extractions, temporary fillings, pulpectomy/pulp debridement, uncomplicated abscess drainage. Payment Options: PAYMENT IS DUE AT THE TIME OF SERVICE.  Fee is usually $100-200, additional surgical procedures (e.g. abscess drainage) may be extra. Cash, checks, Visa/MasterCard accepted.  Can file Medicaid if patient is covered for dental - patient should call case worker to check. No discount for [facility] patients. Best way to get seen: MUST call the day  before and get onto the schedule. Can usually be seen the next 1-2 days. No walk-ins accepted.     [facility] [phone]   Location: [facility], [address] [place] Clinic Hours: M, W, Th, F 8am or 1:30pm, Tues 9a or 1:30 - first come/first served. Services: Simple extractions, temporary fillings, uncomplicated abscess drainage.  You do not need to be an [facility] resident. Payment Options: PAYMENT IS DUE AT THE TIME OF SERVICE. Dental insurance, otherwise sliding scale - bring proof of income or support. Depending on income and treatment needed, cost is usually $50-200. Best way to get seen: Arrive early as it is first come/first served.     [facility] [phone]   Location: [address] Clinic Hours: Mon-Thu 8a-5p Services: Most basic dental services including extractions and fillings. Payment Options: PAYMENT IS DUE AT THE TIME OF SERVICE. Sliding scale, up to 50% off - bring proof if income or support. Medicaid with dental option accepted. Best way to get seen: Call to schedule an appointment, can usually be seen within 2 weeks OR they will try to see walk-ins - show up at 8a or 2p (you may have to wait).     [facility] [phone] [place] RESIDENTS ONLY   Location: [facility], [address] [place], [place] [address] Clinic Hours: By appointment only. Monday - Thursday 8am-5pm, Friday 8am-12pm Services: Cleanings, fillings, extractions. Payment Options: PAYMENT IS DUE AT THE TIME OF SERVICE. Cash, Visa or MasterCard. Sliding scale - $30 minimum per service. Best way to get seen: Come in to office, complete packet and make an appointment - need proof of income or support monies for each household member and proof of [employer]  [place] residence. Usually takes about a month to get in.     [facility] [phone]   Location: [address], [place] Clinic Hours: Walk-in Urgent Care Dental Services are offered Monday-Friday mornings only. The numbers of emergencies accepted daily is limited to the number of providers available. Maximum 15 - Mondays, Wednesdays & Thursdays Maximum 10 - Tuesdays & Fridays Services: You do not need to be a [facility] resident to be seen for a dental emergency. Emergencies are defined as pain, swelling, abnormal bleeding, or dental trauma. Walkins will receive x-rays if needed. NOTE: Dental cleaning is not an emergency. Payment Options: PAYMENT IS DUE AT THE TIME OF SERVICE. Minimum co-pay is $40.00 for uninsured patients. Minimum co-pay is $3.00 for Medicaid with dental coverage. Dental Insurance is accepted and must be presented at time of visit. Medicare does not cover dental. Forms of payment: Cash, credit card, checks. Best way to get seen: If not previously registered with the clinic, walk-in dental registration begins at 7:15 am and is on a first come/first serve basis. If previously registered with the clinic, call to make an appointment.     [facility] [place] ONLY   Location: [address] [place], [place] Clinic Hours: Mon-Thu 10a-2p Services: Extractions only! Payment Options: FREE (donations accepted) - bring proof of income or support Best way to get seen: Call and schedule an appointment OR come at 8am on the 1st Monday of every month (except for holidays) when it is first come/first served.     [facility] [phone]   Location: [place], [place] Clinic Hours: Friday mornings Services, Payment Options, Best way to get seen: Call for info

## 2020-08-05 ENCOUNTER — Emergency Department
Admission: EM | Admit: 2020-08-05 | Discharge: 2020-08-05 | Disposition: A | Discharge status: Home / Self Care | Payer: Self-pay | Attending: Student in an Organized Health Care Education/Training Program | Admitting: Student in an Organized Health Care Education/Training Program

## 2020-08-05 ENCOUNTER — Other Ambulatory Visit: Payer: Self-pay

## 2020-08-05 DIAGNOSIS — L255 Unspecified contact dermatitis due to plants, except food: Secondary | ICD-10-CM | POA: Insufficient documentation

## 2020-08-05 DIAGNOSIS — F17210 Nicotine dependence, cigarettes, uncomplicated: Secondary | ICD-10-CM | POA: Insufficient documentation

## 2020-08-05 DIAGNOSIS — J45909 Unspecified asthma, uncomplicated: Secondary | ICD-10-CM | POA: Insufficient documentation

## 2020-08-05 MED ORDER — PREDNISONE 10 MG PO TABS: ORAL_TABLET | ORAL | 0 refills | Status: DC

## 2020-08-05 NOTE — ED Triage Notes (Signed)
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

## 2020-08-05 NOTE — ED Notes (Signed)
Pt reports rash to arms, neck and stomach with itching and burning.  Red rash noted.  Reports using lotion with no relief.  Pt in NAD at this time

## 2020-08-05 NOTE — ED Provider Notes (Signed)
[facility] Provider Note  ____________________________________________  Time seen: Approximately 3:22 PM  I have reviewed the triage vital signs and the nursing notes.   HISTORY  Chief Complaint Rash    HPI Nathan Sanford is a 29 y.o. male that presents to the emergency department for evaluation of rash to bilateral forearms, neck, abdomen for 3 days.  Patient was working outside when he developed rash to both arms.  Rash has now spread to the side of his neck and the left side of his abdomen.  Rash itches and burns.  He has been applying calamine lotion but that makes it burn as well.  He is unaware of any contact with poison ivy or poison oak.  He is unsure what he came into contact with.  No bug bites.  No fevers.   Past Medical History:  Diagnosis Date  . Asthma     There are no problems to display for this patient.   No past surgical history on file.  Prior to Admission medications   Medication Sig Start Date End Date Taking? Authorizing Provider  diazepam (VALIUM) 5 MG tablet Take 1 tablet (5 mg total) by mouth every 8 (eight) hours as needed for muscle spasms. 01/13/20   Emily Filbert, MD  predniSONE (DELTASONE) 10 MG tablet Take 6 tablets on day 1, take 5 tablets on day 2, take 4 tablets on day 3, take 3 tablets on day 4, take 2 tablets on day 5, take 1 tablet on day 6 08/05/20   Enid Derry, PA-C    Allergies Other  No family history on file.  Social History Social History   Tobacco Use  . Smoking status: Current Every Day Smoker    Packs/day: 0.25    Types: Cigarettes  . Smokeless tobacco: Never Used  Vaping Use  . Vaping Use: Never used  Substance Use Topics  . Alcohol use: No  . Drug use: Not on file     Review of Systems  Constitutional: No fever/chills Respiratory: No SOB. Gastrointestinal: No nausea, no vomiting.  Musculoskeletal: Negative for musculoskeletal pain. Skin: Negative for  abrasions, lacerations, ecchymosis. Positive for rash. Neurological: Negative for headaches   ____________________________________________   PHYSICAL EXAM:  VITAL SIGNS: ED Triage Vitals  Enc Vitals Group     BP 08/05/20 1341 118/84     Pulse Rate 08/05/20 1341 66     Resp 08/05/20 1341 16     Temp 08/05/20 1341 98.4 F (36.9 C)     Temp Source 08/05/20 1341 Oral     SpO2 --      Weight 08/05/20 1342 137 lb 5.6 oz (62.3 kg)     Height 08/05/20 1342 5\' 8"  (1.727 m)     Head Circumference --      Peak Flow --      Pain Score 08/05/20 1342 9     Pain Loc --      Pain Edu? --      Excl. in GC? --      Constitutional: Alert and oriented. Well appearing and in no acute distress. Eyes: Conjunctivae are normal. PERRL. EOMI. Head: Atraumatic. ENT:      Ears:      Nose: No congestion/rhinnorhea.      Mouth/Throat: Mucous membranes are moist.  Neck: No stridor.  Cardiovascular: Normal rate, regular rhythm.  Good peripheral circulation. Respiratory: Normal respiratory effort without tachypnea or retractions. Lungs CTAB. Good air entry to the bases with  no decreased or absent breath sounds. Musculoskeletal: Full range of motion to all extremities. No gross deformities appreciated. Neurologic:  Normal speech and language. No gross focal neurologic deficits are appreciated.  Skin:  Skin is warm, dry. Few vesicles to bilateral forearms, right side of neck and left lower abdomen.  Psychiatric: Mood and affect are normal. Speech and behavior are normal. Patient exhibits appropriate insight and judgement.   ____________________________________________   LABS (all labs ordered are listed, but only abnormal results are displayed)  Labs Reviewed - No data to display ____________________________________________  EKG   ____________________________________________  RADIOLOGY   No results found.  ____________________________________________    PROCEDURES  Procedure(s)  performed:    Procedures    Medications - No data to display   ____________________________________________   INITIAL IMPRESSION / ASSESSMENT AND PLAN / ED COURSE  Pertinent labs & imaging results that were available during my care of the patient were reviewed by me and considered in my medical decision making (see chart for details).  Review of the [place] CSRS was performed in accordance of the [facility] prior to dispensing any controlled drugs.  Patient's diagnosis is consistent with contact dermatitis. Patient will be discharged home with prescriptions for prednisone. Patient is to follow up with PCP as directed. Patient is given ED precautions to return to the ED for any worsening or new symptoms.  Nathan Sanford was evaluated in Emergency Department on 08/05/2020 for the symptoms described in the history of present illness. He was evaluated in the context of the global COVID-19 pandemic, which necessitated consideration that the patient might be at risk for infection with the SARS-CoV-2 virus that causes COVID-19. Institutional protocols and algorithms that pertain to the evaluation of patients at risk for COVID-19 are in a state of rapid change based on information released by regulatory bodies including the CDC and federal and state organizations. These policies and algorithms were followed during the patient's care in the ED.   ____________________________________________  FINAL CLINICAL IMPRESSION(S) / ED DIAGNOSES  Final diagnoses:  Contact dermatitis due to plants, except food, unspecified contact dermatitis type      NEW MEDICATIONS STARTED DURING THIS VISIT:  ED Discharge Orders         Ordered    predniSONE (DELTASONE) 10 MG tablet     Discontinue  Reprint     08/05/20 1526              This chart was dictated using voice recognition software/Dragon. Despite best efforts to proofread, errors can occur which can change the meaning. Any change was purely  unintentional.    Enid Derry, PA-C 08/05/20 1644    Willy Eddy, MD 08/06/20 1540

## 2020-10-16 IMAGING — CR RIGHT SHOULDER - 2+ VIEW
1 series · 3 of 3 positions shown · non-contrast
Comparison: None.

CLINICAL DATA: Shoulder pain for several hours following heavy
lifting, initial encounter

EXAM:
RIGHT SHOULDER - 2+ VIEW

[Series 1: dg shoulder right · 0.14mm/px · 3 of 3 slices shown]
[im 1/3]
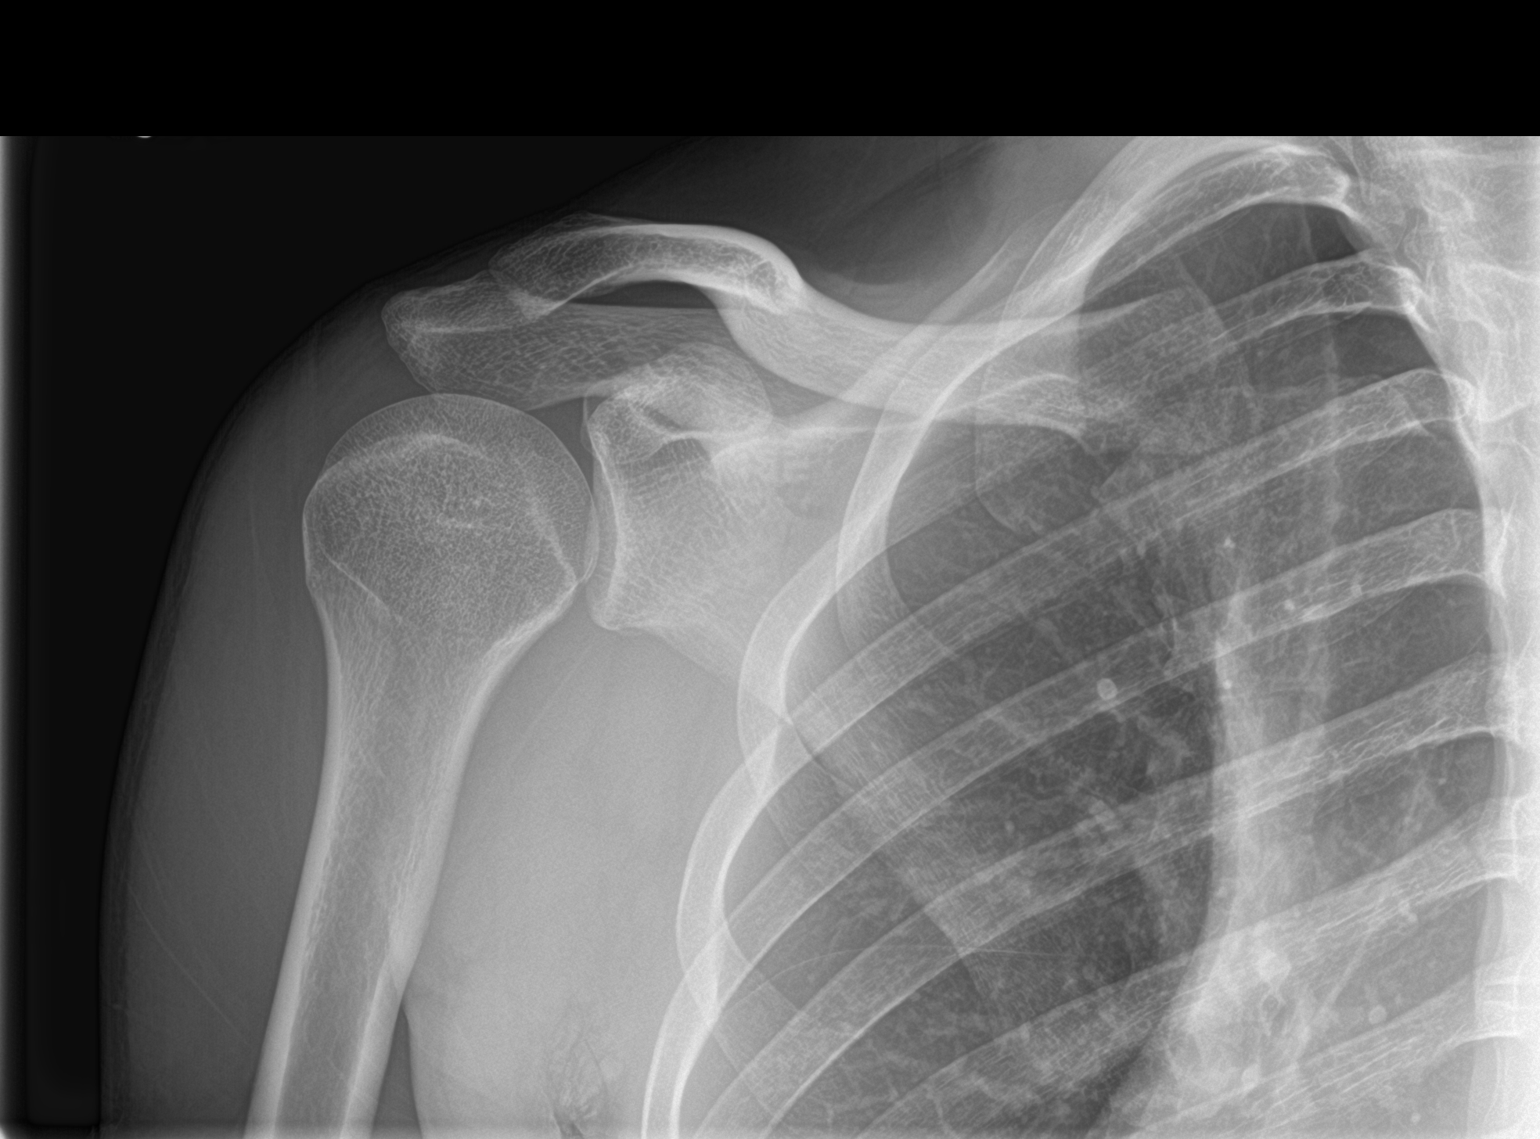
[im 2/3]
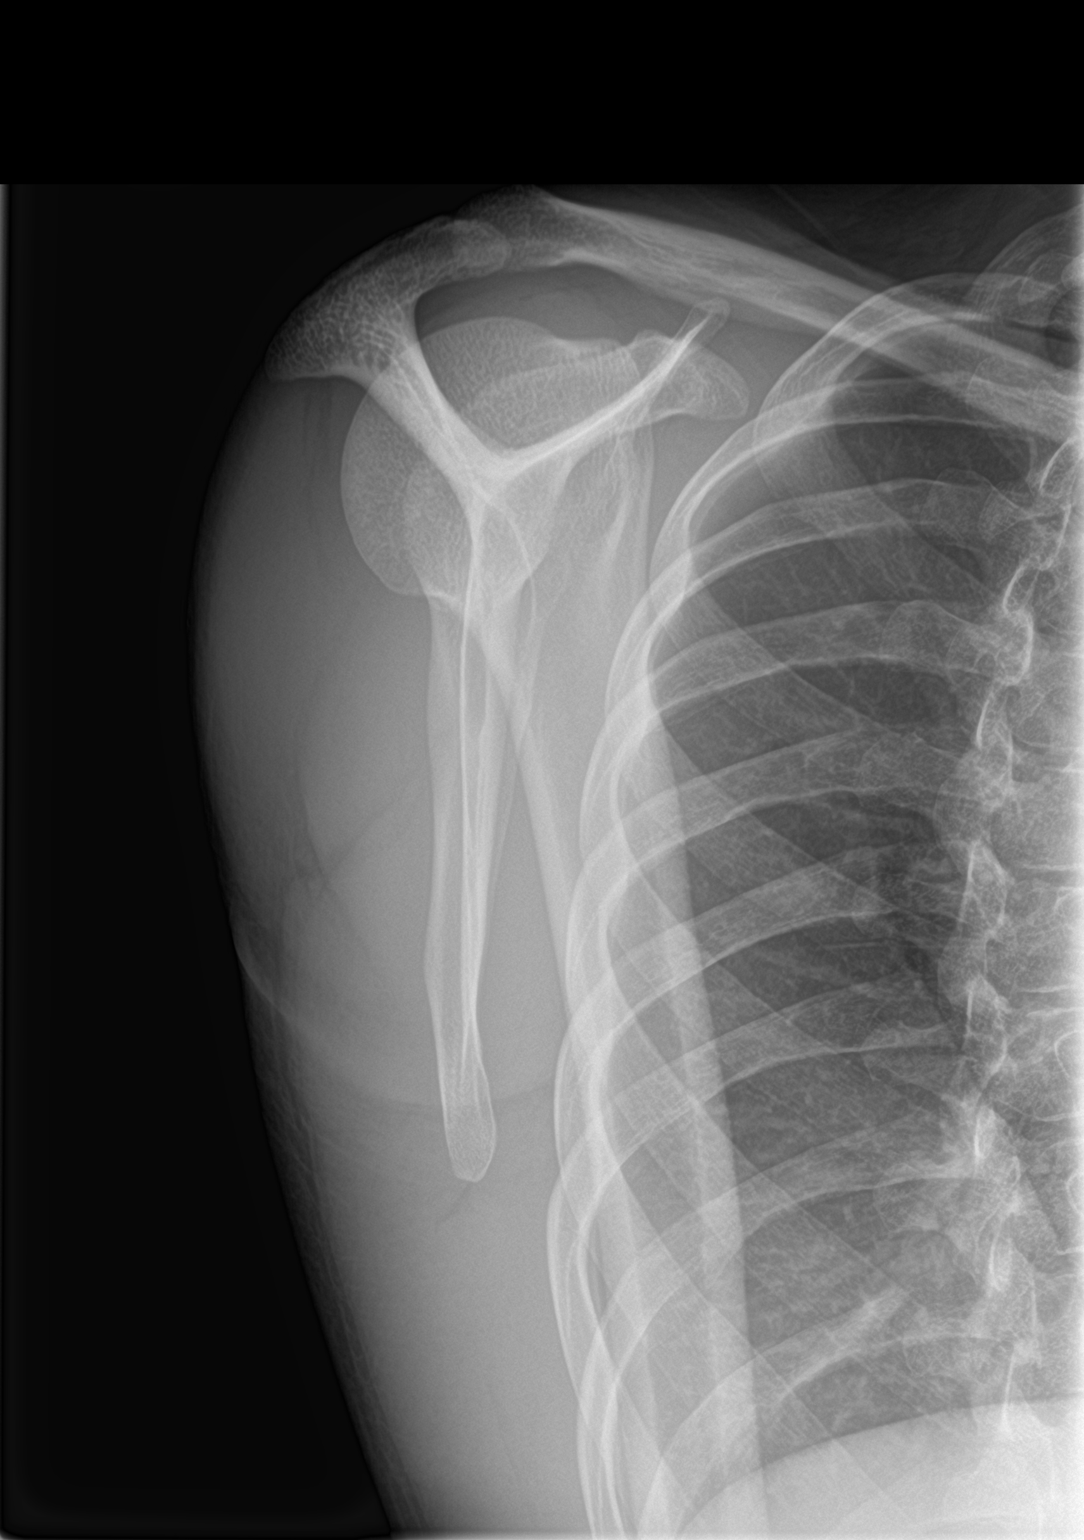
[im 3/3]
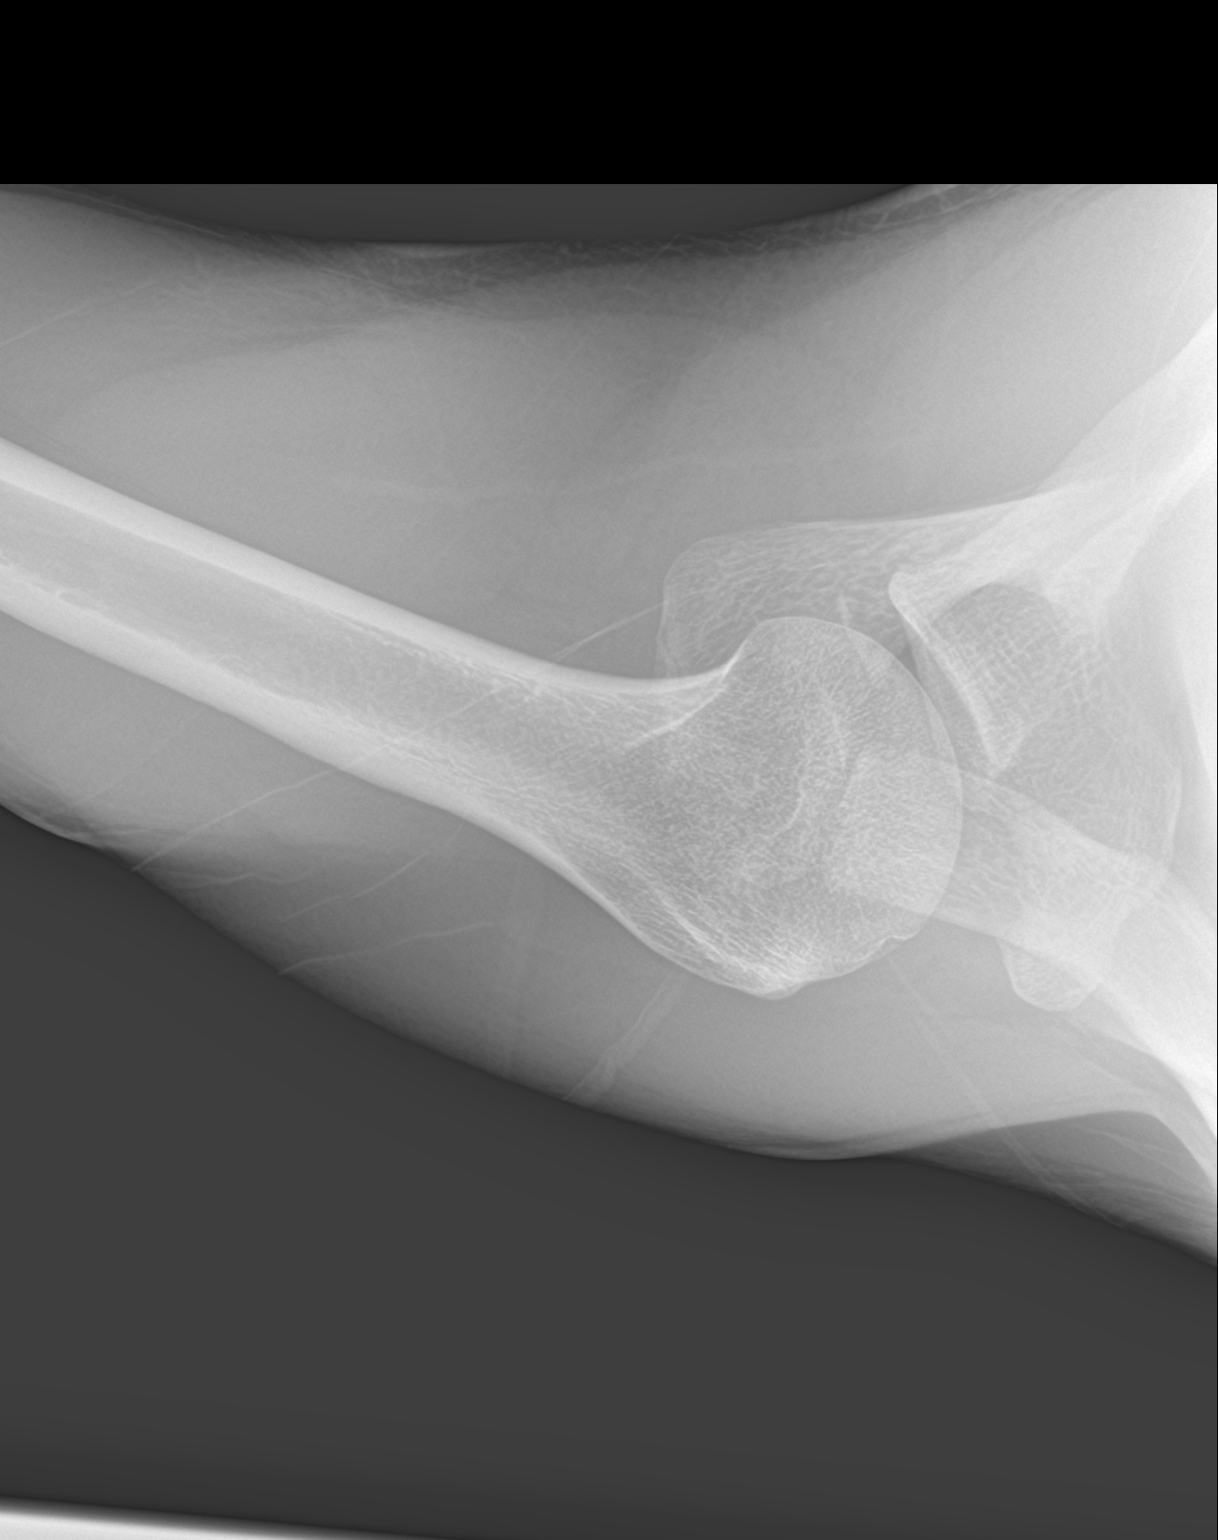

[3 of 3 positions shown; findings below may reference images not displayed]

FINDINGS: There is no evidence of fracture or dislocation. There is no
evidence of arthropathy or other focal bone abnormality. Soft
tissues are unremarkable.
IMPRESSION: No acute abnormality noted.

## 2021-01-17 ENCOUNTER — Other Ambulatory Visit: Payer: Self-pay

## 2021-01-17 ENCOUNTER — Emergency Department
Admission: EM | Admit: 2021-01-17 | Discharge: 2021-01-20 | Disposition: A | Discharge status: Home / Self Care | Payer: Self-pay | Attending: Emergency Medicine | Admitting: Emergency Medicine

## 2021-01-17 DIAGNOSIS — Z5321 Procedure and treatment not carried out due to patient leaving prior to being seen by health care provider: Secondary | ICD-10-CM | POA: Insufficient documentation

## 2021-01-17 DIAGNOSIS — M549 Dorsalgia, unspecified: Secondary | ICD-10-CM | POA: Insufficient documentation

## 2021-01-17 NOTE — ED Triage Notes (Signed)
Pt c/o back pain x 2 weeks. Denies injury or falling. A&O, ambulatory. No distress noted.

## 2021-03-17 IMAGING — CR DG LUMBAR SPINE 2-3V
1 series · 3 of 3 positions shown · non-contrast
Comparison: None.

CLINICAL DATA: Acute low back pain without known injury

EXAM:
LUMBAR SPINE - 2-3 VIEW

[Series 1: dg lumbar spine 2-3 views · 0.14mm/px · 3 of 3 slices shown]
[im 1/3]
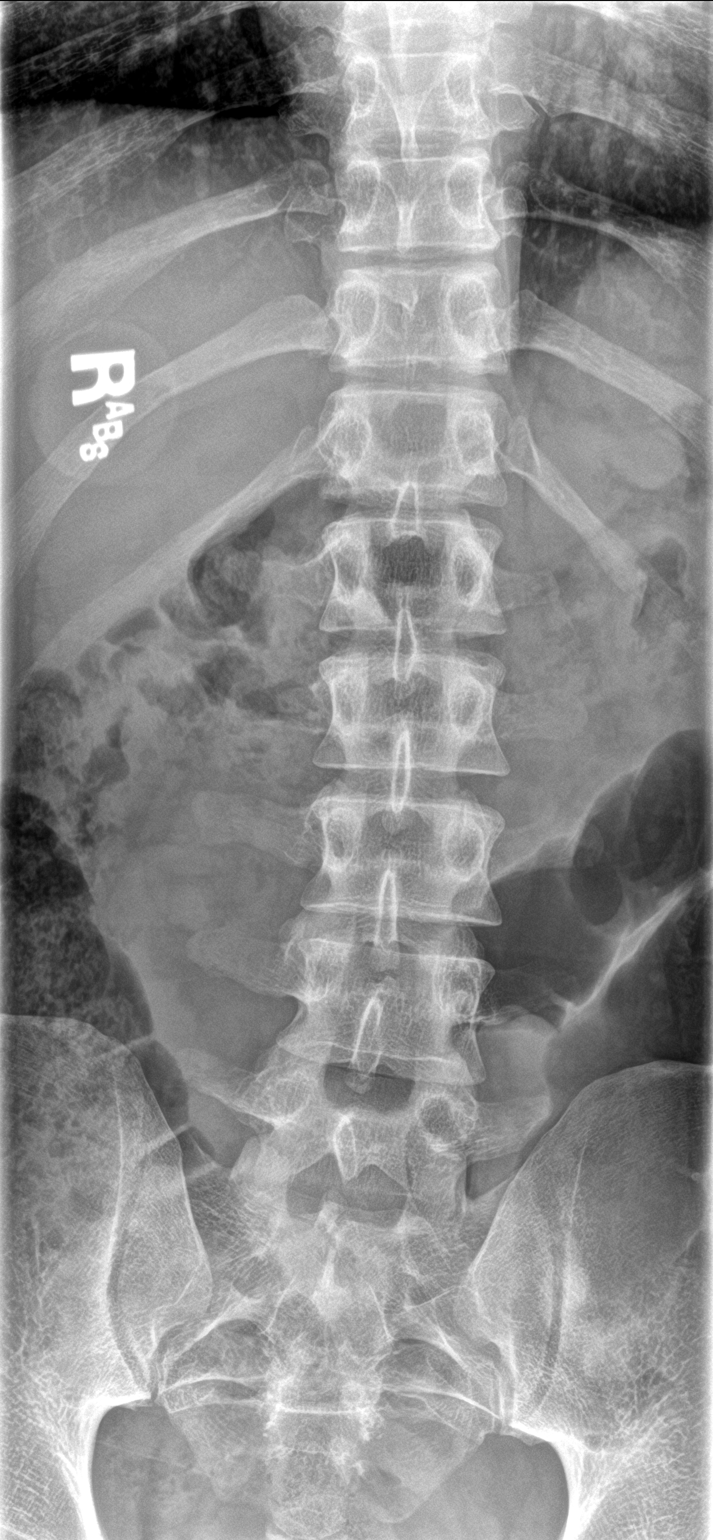
[im 2/3]
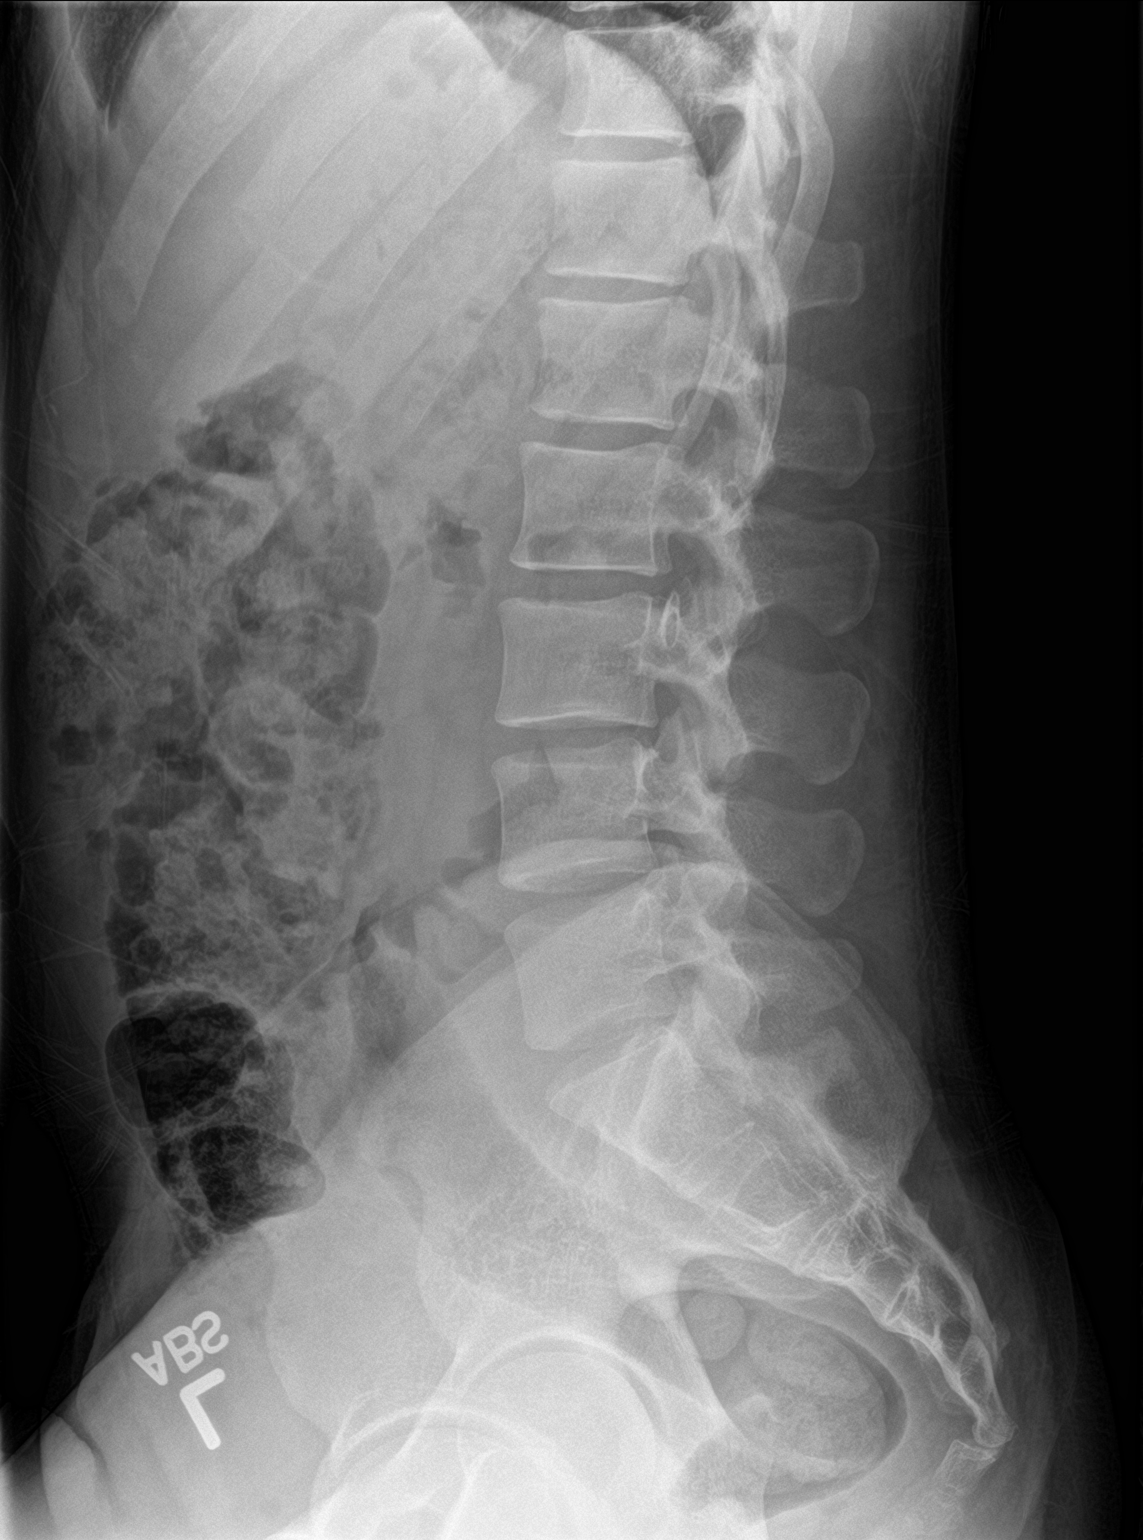
[im 3/3]
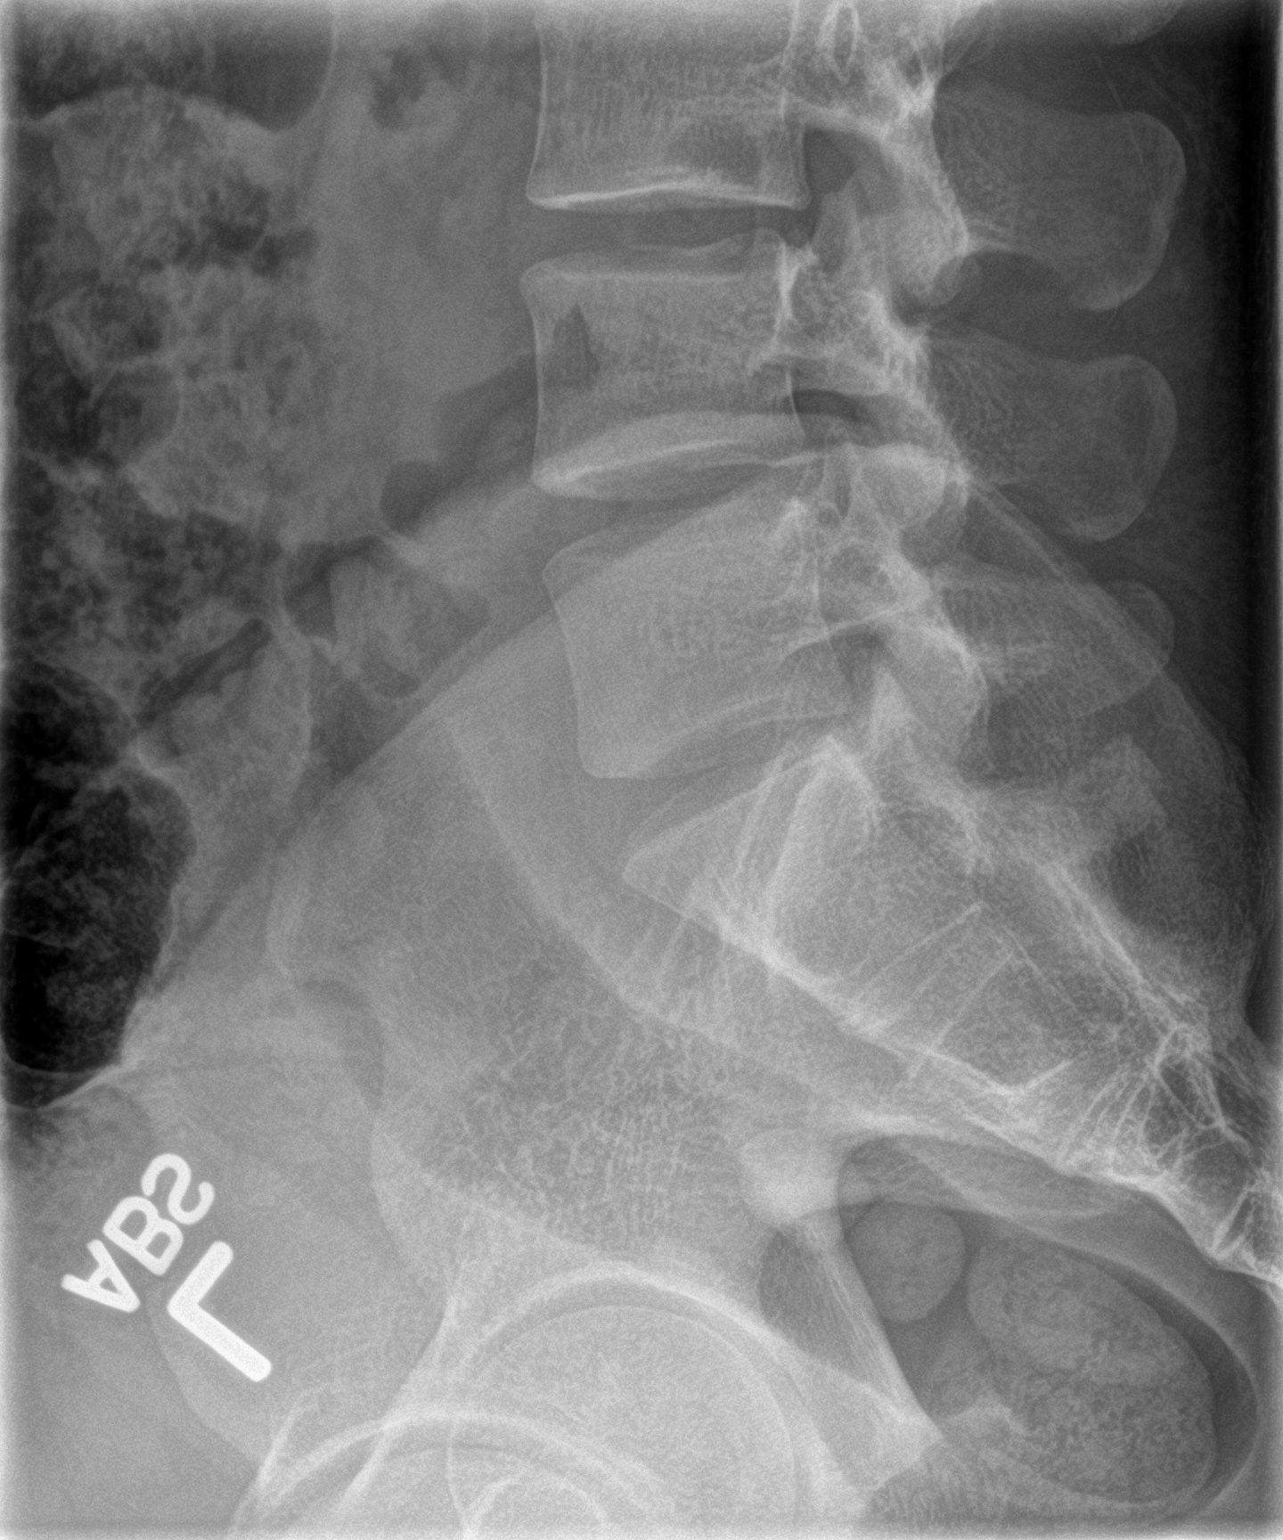

[3 of 3 positions shown; findings below may reference images not displayed]

FINDINGS: There is no evidence of lumbar spine fracture. Alignment is normal.
Intervertebral disc spaces are maintained.
IMPRESSION: Negative.

## 2021-05-17 ENCOUNTER — Encounter: Payer: Self-pay | Admitting: Emergency Medicine

## 2021-05-17 ENCOUNTER — Emergency Department: Payer: Self-pay

## 2021-05-17 ENCOUNTER — Other Ambulatory Visit: Payer: Self-pay

## 2021-05-17 ENCOUNTER — Emergency Department
Admission: EM | Admit: 2021-05-17 | Discharge: 2021-05-17 | Disposition: A | Discharge status: Home / Self Care | Payer: Self-pay | Attending: Emergency Medicine | Admitting: Emergency Medicine

## 2021-05-17 DIAGNOSIS — J45909 Unspecified asthma, uncomplicated: Secondary | ICD-10-CM | POA: Insufficient documentation

## 2021-05-17 DIAGNOSIS — Z91010 Allergy to peanuts: Secondary | ICD-10-CM | POA: Insufficient documentation

## 2021-05-17 DIAGNOSIS — U071 COVID-19: Secondary | ICD-10-CM | POA: Insufficient documentation

## 2021-05-17 DIAGNOSIS — B349 Viral infection, unspecified: Secondary | ICD-10-CM | POA: Insufficient documentation

## 2021-05-17 DIAGNOSIS — F17210 Nicotine dependence, cigarettes, uncomplicated: Secondary | ICD-10-CM | POA: Insufficient documentation

## 2021-05-17 LAB — RESP PANEL BY RT-PCR (FLU A&B, COVID) ARPGX2
Influenza A by PCR: NEGATIVE
Influenza B by PCR: NEGATIVE
SARS Coronavirus 2 by RT PCR: POSITIVE — AB

## 2021-05-17 MED ORDER — IBUPROFEN 600 MG PO TABS
600.0000 mg | ORAL_TABLET | Freq: Once | ORAL | Status: AC
  Administered 2021-05-17: 600 mg via ORAL
  Filled 2021-05-17: qty 1

## 2021-05-17 NOTE — ED Provider Notes (Signed)
[facility]EMERGENCY DEPARTMENT  ____________________________________________  Time seen: Approximately 10:30 PM  I have reviewed the triage vital signs and the nursing notes.   HISTORY  Chief Complaint Fever and Generalized Body Aches   Historian Patient    HPI Nathan Sanford is a 30 y.o. male presents to the emergency department with fever, body aches, cough and chills.  Patient has had symptoms for the past 2 to 3 days.  No chest pain, chest tightness or abdominal pain.  No vomiting or diarrhea.  No recent travel.  No changes in stooling or urinary output.   Past Medical History:  Diagnosis Date  . Asthma      Immunizations up to date:  Yes.     Past Medical History:  Diagnosis Date  . Asthma     There are no problems to display for this patient.   History reviewed. No pertinent surgical history.  Prior to Admission medications   Medication Sig Start Date End Date Taking? Authorizing Provider  diazepam (VALIUM) 5 MG tablet Take 1 tablet (5 mg total) by mouth every 8 (eight) hours as needed for muscle spasms. 01/13/20   Emily Filbert, MD  predniSONE (DELTASONE) 10 MG tablet Take 6 tablets on day 1, take 5 tablets on day 2, take 4 tablets on day 3, take 3 tablets on day 4, take 2 tablets on day 5, take 1 tablet on day 6 08/05/20   Enid Derry, PA-C    Allergies Peanut-containing drug products and Other  History reviewed. No pertinent family history.  Social History Social History   Tobacco Use  . Smoking status: Current Every Day Smoker    Packs/day: 0.25    Types: Cigarettes  . Smokeless tobacco: Never Used  Vaping Use  . Vaping Use: Never used  Substance Use Topics  . Alcohol use: No     Review of Systems  Constitutional: Patient has fever.  Eyes:  No discharge ENT: No upper respiratory complaints. Respiratory: Patient has cough. No SOB/ use of accessory muscles to breath Gastrointestinal:   No nausea, no vomiting.  No diarrhea.  No  constipation. Musculoskeletal: Negative for musculoskeletal pain. Skin: Negative for rash, abrasions, lacerations, ecchymosis.    ____________________________________________   PHYSICAL EXAM:  VITAL SIGNS: ED Triage Vitals [05/17/21 1923]  Enc Vitals Group     BP 117/66     Pulse Rate 98     Resp 16     Temp (!) 101.4 F (38.6 C)     Temp Source Oral     SpO2 97 %     Weight 150 lb (68 kg)     Height 5\' 7"  (1.702 m)     Head Circumference      Peak Flow      Pain Score 8     Pain Loc      Pain Edu?      Excl. in GC?      Constitutional: Alert and oriented. Patient is lying supine. Eyes: Conjunctivae are normal. PERRL. EOMI. Head: Atraumatic. ENT:      Ears: Tympanic membranes are mildly injected with mild effusion bilaterally.       Nose: No congestion/rhinnorhea.      Mouth/Throat: Mucous membranes are moist. Posterior pharynx is mildly erythematous.  Hematological/Lymphatic/Immunilogical: No cervical lymphadenopathy.  Cardiovascular: Normal rate, regular rhythm. Normal S1 and S2.  Good peripheral circulation. Respiratory: Normal respiratory effort without tachypnea or retractions. Lungs CTAB. Good air entry to the bases with no decreased or absent  breath sounds. Gastrointestinal: Bowel sounds 4 quadrants. Soft and nontender to palpation. No guarding or rigidity. No palpable masses. No distention. No CVA tenderness. Musculoskeletal: Full range of motion to all extremities. No gross deformities appreciated. Neurologic:  Normal speech and language. No gross focal neurologic deficits are appreciated.  Skin:  Skin is warm, dry and intact. No rash noted. Psychiatric: Mood and affect are normal. Speech and behavior are normal. Patient exhibits appropriate insight and judgement.    ____________________________________________   LABS (all labs ordered are listed, but only abnormal results are displayed)  Labs Reviewed  RESP PANEL BY RT-PCR (FLU A&B, COVID) [id number]    ____________________________________________  EKG   ____________________________________________  RADIOLOGY Geraldo Pitter, personally viewed and evaluated these images (plain radiographs) as part of my medical decision making, as well as reviewing the written report by the radiologist.  DG Chest 2 View  Result Date: 05/17/2021 CLINICAL DATA:  Productive cough, fever EXAM: CHEST - 2 VIEW COMPARISON:  None. FINDINGS: No consolidation, features of edema, pneumothorax, or effusion. Pulmonary vascularity is normally distributed. The cardiomediastinal contours are unremarkable. No acute osseous or soft tissue abnormality. IMPRESSION: No acute cardiopulmonary abnormality. Electronically Signed   By: Kreg Shropshire M.D.   On: 05/17/2021 19:41    ____________________________________________    PROCEDURES  Procedure(s) performed:     Procedures     Medications  ibuprofen (ADVIL) tablet 600 mg (600 mg Oral Given 05/17/21 1932)     ____________________________________________   INITIAL IMPRESSION / ASSESSMENT AND PLAN / ED COURSE  Pertinent labs & imaging results that were available during my care of the patient were reviewed by me and considered in my medical decision making (see chart for details).      Assessment and plan Viral illness 30 year old male presents to the emergency department with fever, body aches and cough.  Patient was febrile at triage but vital signs were otherwise reassuring.  On exam, patient was alert, active and nontoxic-appearing.  Chest x-ray shows no consolidations, opacities or infiltrates to suggest community-acquired pneumonia.  Tylenol and ibuprofen alternating were recommended for fever if fever occurs.  All patient questions were answered    ____________________________________________  FINAL CLINICAL IMPRESSION(S) / ED DIAGNOSES  Final diagnoses:  Viral illness      NEW MEDICATIONS STARTED DURING THIS VISIT:  ED Discharge  Orders    None          This chart was dictated using voice recognition software/Dragon. Despite best efforts to proofread, errors can occur which can change the meaning. Any change was purely unintentional.     Kimm, Ungaro, PA-C 05/17/21 2233    Dionne Bucy, MD 05/17/21 2238

## 2021-05-17 NOTE — ED Triage Notes (Signed)
Pt to ED from home c/o fever of 102.2 and took liquid tylenol around 1700 and chills and body aches.  Denies known COVID exposure.  Productive cough at work.  Pt A&Ox4, chest rise even and unlabored, skin WNL, in NAD at this time.

## 2021-05-18 ENCOUNTER — Telehealth: Payer: Self-pay

## 2021-05-18 NOTE — Telephone Encounter (Signed)
Called to discuss with patient about COVID-19 symptoms and the use of one of the available treatments for those with mild to moderate Covid symptoms and at a high risk of hospitalization.  Pt appears to qualify for outpatient treatment due to co-morbid conditions and/or a member of an at-risk group in accordance with the FDA Emergency Use Authorization.    Symptom onset: 05/15/21 Fever,cough,body aches Vaccinated: No Booster? No Immunocompromised? No Qualifiers: Asthma NIH Criteria: Tier 2  Declines further treatment.   Nathan Sanford

## 2021-08-29 ENCOUNTER — Encounter: Payer: Self-pay | Admitting: Emergency Medicine

## 2021-08-29 ENCOUNTER — Emergency Department
Admission: EM | Admit: 2021-08-29 | Discharge: 2021-08-30 | Disposition: A | Discharge status: Home / Self Care | Payer: 59 | Attending: Emergency Medicine | Admitting: Emergency Medicine

## 2021-08-29 ENCOUNTER — Other Ambulatory Visit: Payer: Self-pay

## 2021-08-29 DIAGNOSIS — R300 Dysuria: Secondary | ICD-10-CM | POA: Diagnosis not present

## 2021-08-29 DIAGNOSIS — Z202 Contact with and (suspected) exposure to infections with a predominantly sexual mode of transmission: Secondary | ICD-10-CM | POA: Diagnosis not present

## 2021-08-29 DIAGNOSIS — J45909 Unspecified asthma, uncomplicated: Secondary | ICD-10-CM | POA: Insufficient documentation

## 2021-08-29 DIAGNOSIS — F17210 Nicotine dependence, cigarettes, uncomplicated: Secondary | ICD-10-CM | POA: Diagnosis not present

## 2021-08-29 LAB — URINALYSIS, COMPLETE (UACMP) WITH MICROSCOPIC
Bilirubin Urine: NEGATIVE
Glucose, UA: NEGATIVE mg/dL
Hgb urine dipstick: NEGATIVE
Ketones, ur: NEGATIVE mg/dL
Leukocytes,Ua: NEGATIVE
Nitrite: NEGATIVE
Protein, ur: NEGATIVE mg/dL
Specific Gravity, Urine: >1.03 — ABNORMAL HIGH (ref 1.005–1.030)
pH: 5.5 (ref 5.0–8.0)

## 2021-08-29 MED ORDER — CEFTRIAXONE SODIUM 1 G IJ SOLR
500.0000 mg | Freq: Once | INTRAMUSCULAR | Status: AC
  Administered 2021-08-29: 500 mg via INTRAMUSCULAR
  Filled 2021-08-29: qty 10

## 2021-08-29 MED ORDER — DOXYCYCLINE MONOHYDRATE 100 MG PO TABS: 100.0000 mg | ORAL_TABLET | Freq: Two times a day (BID) | ORAL | 0 refills | Status: AC

## 2021-08-29 MED ORDER — METRONIDAZOLE 500 MG PO TABS
2000.0000 mg | ORAL_TABLET | Freq: Once | ORAL | Status: AC
  Administered 2021-08-29: 2000 mg via ORAL
  Filled 2021-08-29: qty 4

## 2021-08-29 NOTE — ED Notes (Signed)
Urine sent in GC/CT container as well.

## 2021-08-29 NOTE — ED Provider Notes (Signed)
[facility]EM[facility]  ____________________________________________  Time seen: Approximately 11:40 PM  I have reviewed the triage vital signs and the nursing notes.   HISTORY  Chief Complaint Dysuria   Historian Patient     HPI Nathan Sanford is a 30 y.o. male presents to the emergency department with dysuria for the past 3 days.  No penile discharge.  Patient has had recent unprotected sex.  No fever, nausea, vomiting pain.   Past Medical History:  Diagnosis Date   Asthma      Immunizations up to date:  Yes.     Past Medical History:  Diagnosis Date   Asthma     There are no problems to display for this patient.   History reviewed. No pertinent surgical history.  Prior to Admission medications   Medication Sig Start Date End Date Taking? Authorizing Provider  doxycycline (ADOXA) 100 MG tablet Take 1 tablet (100 mg total) by mouth 2 (two) times daily for 7 days. 08/29/21 09/05/21 Yes Pia Mau M, PA-C  diazepam (VALIUM) 5 MG tablet Take 1 tablet (5 mg total) by mouth every 8 (eight) hours as needed for muscle spasms. 01/13/20   Emily Filbert, MD  predniSONE (DELTASONE) 10 MG tablet Take 6 tablets on day 1, take 5 tablets on day 2, take 4 tablets on day 3, take 3 tablets on day 4, take 2 tablets on day 5, take 1 tablet on day 6 08/05/20   Enid Derry, PA-C    Allergies Peanut-containing drug products and Other  History reviewed. No pertinent family history.  Social History Social History   Tobacco Use   Smoking status: Every Day    Packs/day: 0.25    Types: Cigarettes   Smokeless tobacco: Never  Vaping Use   Vaping Use: Never used  Substance Use Topics   Alcohol use: No     Review of Systems  Constitutional: No fever/chills Eyes:  No discharge ENT: No upper respiratory complaints. Respiratory: no cough. No SOB/ use of accessory muscles to breath Gastrointestinal:   No nausea, no vomiting.  No diarrhea.  No  constipation. Genitourinary: Patient has dysuria.  Musculoskeletal: Negative for musculoskeletal pain. Skin: Negative for rash, abrasions, lacerations, ecchymosis.   ____________________________________________   PHYSICAL EXAM:  VITAL SIGNS: ED Triage Vitals  Enc Vitals Group     BP 08/29/21 1943 124/87     Pulse Rate 08/29/21 1943 78     Resp 08/29/21 1943 18     Temp 08/29/21 1943 98.8 F (37.1 C)     Temp Source 08/29/21 1943 Oral     SpO2 08/29/21 1943 97 %     Weight 08/29/21 1953 140 lb (63.5 kg)     Height 08/29/21 1953 5\' 7"  (1.702 m)     Head Circumference --      Peak Flow --      Pain Score 08/29/21 1952 7     Pain Loc --      Pain Edu? --      Excl. in GC? --      Constitutional: Alert and oriented. Well appearing and in no acute distress. Eyes: Conjunctivae are normal. PERRL. EOMI. Head: Atraumatic. ENT: Cardiovascular: Normal rate, regular rhythm. Normal S1 and S2.  Good peripheral circulation. Respiratory: Normal respiratory effort without tachypnea or retractions. Lungs CTAB. Good air entry to the bases with no decreased or absent breath sounds Gastrointestinal: Bowel sounds x 4 quadrants. Soft and nontender to palpation. No guarding or rigidity. No distention. Musculoskeletal:  Full range of motion to all extremities. No obvious deformities noted Neurologic:  Normal for age. No gross focal neurologic deficits are appreciated.  Skin:  Skin is warm, dry and intact. No rash noted. Psychiatric: Mood and affect are normal for age. Speech and behavior are normal.   ____________________________________________   LABS (all labs ordered are listed, but only abnormal results are displayed)  Labs Reviewed  URINALYSIS, COMPLETE (UACMP) WITH MICROSCOPIC - Abnormal; Notable for the following components:      Result Value   Specific Gravity, Urine >1.030 (*)    Bacteria, UA RARE (*)    All other components within normal limits    ____________________________________________  EKG   ____________________________________________  RADIOLOGY   No results found.  ____________________________________________    PROCEDURES  Procedure(s) performed:     Procedures     Medications  cefTRIAXone (ROCEPHIN) injection 500 mg (has no administration in time range)  metroNIDAZOLE (FLAGYL) tablet 2,000 mg (has no administration in time range)     ____________________________________________   INITIAL IMPRESSION / ASSESSMENT AND PLAN / ED COURSE  Pertinent labs & imaging results that were available during my care of the patient were reviewed by me and considered in my medical decision making (see chart for details).      Assessment and plan Dysuria 30 year old male presents to the emergency department with dysuria for the past 3 days.  Urinalysis was not suggestive of cystitis with only rare bacteria identified on UA.  Urine culture is in process at this time.  Gonorrhea and Chlamydia testing are in process at this time.  We will treat patient preemptively for gonorrhea, chlamydia and trichomoniasis with Rocephin, Flagyl and doxycycline as an outpatient.  Recommended abstaining from unprotected sex for the next 7 days.     ____________________________________________  FINAL CLINICAL IMPRESSION(S) / ED DIAGNOSES  Final diagnoses:  STD exposure      NEW MEDICATIONS STARTED DURING THIS VISIT:  ED Discharge Orders          Ordered    doxycycline (ADOXA) 100 MG tablet  2 times daily        08/29/21 2305                This chart was dictated using voice recognition software/Dragon. Despite best efforts to proofread, errors can occur which can change the meaning. Any change was purely unintentional.     Blaze, Nylund, PA-C 08/29/21 2342    Sharyn Creamer, MD 08/30/21 [phone]

## 2021-08-29 NOTE — Discharge Instructions (Addendum)
Take Doxycycline twice daily for seven days.  

## 2021-08-29 NOTE — ED Triage Notes (Signed)
Pt arrived via POV with reports of  3 days of burning with urination. Pt denies any penile discharge. C/o low back pain as well.

## 2021-08-30 LAB — CHLAMYDIA/NGC RT PCR (ARMC ONLY)
Chlamydia Tr: NOT DETECTED
N gonorrhoeae: NOT DETECTED

## 2021-08-31 LAB — URINE CULTURE: Culture: NO GROWTH

## 2021-10-27 ENCOUNTER — Other Ambulatory Visit: Payer: Self-pay

## 2021-10-27 ENCOUNTER — Emergency Department: Payer: Self-pay

## 2021-10-27 ENCOUNTER — Encounter: Payer: Self-pay | Admitting: Emergency Medicine

## 2021-10-27 ENCOUNTER — Emergency Department
Admission: EM | Admit: 2021-10-27 | Discharge: 2021-10-27 | Disposition: A | Discharge status: Home / Self Care | Payer: Self-pay | Attending: Emergency Medicine | Admitting: Emergency Medicine

## 2021-10-27 DIAGNOSIS — B349 Viral infection, unspecified: Secondary | ICD-10-CM | POA: Insufficient documentation

## 2021-10-27 DIAGNOSIS — Z20822 Contact with and (suspected) exposure to covid-19: Secondary | ICD-10-CM | POA: Insufficient documentation

## 2021-10-27 DIAGNOSIS — J45909 Unspecified asthma, uncomplicated: Secondary | ICD-10-CM | POA: Insufficient documentation

## 2021-10-27 DIAGNOSIS — F17210 Nicotine dependence, cigarettes, uncomplicated: Secondary | ICD-10-CM | POA: Insufficient documentation

## 2021-10-27 LAB — RESP PANEL BY RT-PCR (FLU A&B, COVID) ARPGX2
Influenza A by PCR: NEGATIVE
Influenza B by PCR: NEGATIVE
SARS Coronavirus 2 by RT PCR: NEGATIVE

## 2021-10-27 NOTE — ED Provider Notes (Signed)
[facility] Emergency Department Provider Note ____________________________________________   Event Date/Time   First MD Initiated Contact with Patient 10/27/21 1115     (approximate)  I have reviewed the triage vital signs and the nursing notes.   HISTORY  Chief Complaint Generalized Body Aches, Fever, and Headache    HPI Nathan Sanford is a 30 y.o. male with a past medical history of asthma who presents with body aches over the last 4 days, persistent course, associated with subjective fever, sore throat, nasal congestion, and a cough productive of yellow sputum.  The patient denies shortness of breath or chest pain.  He states that he tried to go back to work today but was told he needed to be cleared by a doctor first.  He denies any sick contacts.  He states he had COVID several months ago.  Past Medical History:  Diagnosis Date   Asthma     There are no problems to display for this patient.   History reviewed. No pertinent surgical history.  Prior to Admission medications   Not on File    Allergies Peanut-containing drug products and Other  History reviewed. No pertinent family history.  Social History Social History   Tobacco Use   Smoking status: Every Day    Packs/day: 0.25    Types: Cigarettes   Smokeless tobacco: Never  Vaping Use   Vaping Use: Never used  Substance Use Topics   Alcohol use: No    Review of Systems  Constitutional: Positive for fever. Eyes: No redness. ENT: Positive for nasal congestion. Cardiovascular: Denies chest pain. Respiratory: Denies shortness of breath. Gastrointestinal: No vomiting or diarrhea.  Genitourinary: Negative for dysuria.  Musculoskeletal: Positive for myalgias. Skin: Negative for rash. Neurological: Positive for headache.   ____________________________________________   PHYSICAL EXAM:  VITAL SIGNS: ED Triage Vitals  Enc Vitals Group     BP 10/27/21 0942 123/76      Pulse Rate 10/27/21 0942 75     Resp 10/27/21 0942 18     Temp 10/27/21 0942 98.4 F (36.9 C)     Temp Source 10/27/21 0942 Oral     SpO2 10/27/21 0942 94 %     Weight 10/27/21 0942 140 lb (63.5 kg)     Height 10/27/21 0942 5\' 7"  (1.702 m)     Head Circumference --      Peak Flow --      Pain Score 10/27/21 0945 9     Pain Loc --      Pain Edu? --      Excl. in GC? --     Constitutional: Alert and oriented. Well appearing and in no acute distress. Eyes: Conjunctivae are normal.  Head: Atraumatic. Nose: No congestion/rhinnorhea. Mouth/Throat: Mucous membranes are moist.  Oropharynx clear with faint erythema but no exudate. Neck: Normal range of motion.  Cardiovascular: Normal rate, regular rhythm. Grossly normal heart sounds.  Good peripheral circulation. Respiratory: Normal respiratory effort.  No retractions.  Slight wheezing bilaterally. Gastrointestinal: No distention.  Musculoskeletal: Extremities warm and well perfused.  Neurologic:  Normal speech and language. No gross focal neurologic deficits are appreciated.  Skin:  Skin is warm and dry. No rash noted. Psychiatric: Mood and affect are normal. Speech and behavior are normal.  ____________________________________________   LABS (all labs ordered are listed, but only abnormal results are displayed)  Labs Reviewed  RESP PANEL BY RT-PCR (FLU A&B, COVID) ARPGX2   ____________________________________________  EKG   ____________________________________________  RADIOLOGY  Chest x-ray interpreted by me shows no focal consolidation or edema  ____________________________________________   PROCEDURES  Procedure(s) performed: No  Procedures  Critical Care performed: No ____________________________________________   INITIAL IMPRESSION / ASSESSMENT AND PLAN / ED COURSE  Pertinent labs & imaging results that were available during my care of the patient were reviewed by me and considered in my medical decision  making (see chart for details).   30 year old male with history of asthma presents with subjective fever, body aches, URI symptoms and productive cough over the last several days and is requesting clearance to go back to work.  He denies sick contacts.  On exam the patient is well-appearing and his vital signs are normal.  Exam is remarkable only for slight wheezing on lung exam.  Differential includes viral URI, acute bronchitis, pneumonia, COVID-19, influenza.  We will obtain a respiratory panel and a chest x-ray.  ----------------------------------------- 1:31 PM on 10/27/2021 -----------------------------------------   COVID and flu were negative.  Chest x-ray is clear.  The patient is stable for discharge home.  ____________________________________________   FINAL CLINICAL IMPRESSION(S) / ED DIAGNOSES  Final diagnoses:  Viral syndrome      NEW MEDICATIONS STARTED DURING THIS VISIT:  There are no discharge medications for this patient.    Note:  This document was prepared using Dragon voice recognition software and may include unintentional dictation errors.    Dionne Bucy, MD 10/27/21 1827

## 2021-10-27 NOTE — ED Notes (Signed)
See triage note  presents with subjective fever,body aches and nasal congestion

## 2021-10-27 NOTE — Discharge Instructions (Signed)
Your flu and covid swabs are negative.  Use your albuterol inhaler over the next several days as needed, and take over the counter ibuprofen or tylenol as needed for bodyaches or sore throat.

## 2021-10-27 NOTE — ED Triage Notes (Signed)
Pt comes into the ED via  POV c/o flu like symptoms that started Monday. Symptoms include headache, body aches, congestion, and fever.  Pt in NAD at this time with even and unlabored reparations.  Pt states his work will not let him come back until he has a MD note.

## 2022-05-31 ENCOUNTER — Encounter: Payer: Self-pay | Admitting: Emergency Medicine

## 2022-05-31 ENCOUNTER — Other Ambulatory Visit: Payer: Self-pay

## 2022-05-31 ENCOUNTER — Emergency Department: Payer: 59

## 2022-05-31 DIAGNOSIS — R079 Chest pain, unspecified: Secondary | ICD-10-CM | POA: Insufficient documentation

## 2022-05-31 DIAGNOSIS — Z5321 Procedure and treatment not carried out due to patient leaving prior to being seen by health care provider: Secondary | ICD-10-CM | POA: Diagnosis not present

## 2022-05-31 NOTE — ED Triage Notes (Signed)
EMS brings pt to triage via w/c with no distress noted; pt reports mid CP, nonradiating with no accomp symptoms; st pain began PTA while sitting in his car; pt denies hx of same

## 2022-06-01 ENCOUNTER — Telehealth: Payer: Self-pay | Admitting: Emergency Medicine

## 2022-06-01 ENCOUNTER — Emergency Department: Payer: 59

## 2022-06-01 ENCOUNTER — Emergency Department
Admission: EM | Admit: 2022-06-01 | Discharge: 2022-06-01 | Discharge status: Against Medical Advice | Payer: 59 | Attending: Emergency Medicine | Admitting: Emergency Medicine

## 2022-06-01 LAB — CBC
HCT: 41.2 % (ref 39.0–52.0)
Hemoglobin: 13.8 g/dL (ref 13.0–17.0)
MCH: 29.7 pg (ref 26.0–34.0)
MCHC: 33.5 g/dL (ref 30.0–36.0)
MCV: 88.6 fL (ref 80.0–100.0)
Platelets: 249 10*3/uL (ref 150–400)
RBC: 4.65 MIL/uL (ref 4.22–5.81)
RDW: 12.8 % (ref 11.5–15.5)
WBC: 5.8 10*3/uL (ref 4.0–10.5)
nRBC: 0 % (ref 0.0–0.2)

## 2022-06-01 LAB — BASIC METABOLIC PANEL
Anion gap: 5 (ref 5–15)
BUN: 9 mg/dL (ref 6–20)
CO2: 28 mmol/L (ref 22–32)
Calcium: 8.9 mg/dL (ref 8.9–10.3)
Chloride: 108 mmol/L (ref 98–111)
Creatinine, Ser: 0.94 mg/dL (ref 0.61–1.24)
GFR, Estimated: >60 mL/min (ref >60)
Glucose, Bld: 102 mg/dL — ABNORMAL HIGH (ref 70–99)
Potassium: 2.9 mmol/L — ABNORMAL LOW (ref 3.5–5.1)
Sodium: 141 mmol/L (ref 135–145)

## 2022-06-01 LAB — TROPONIN I (HIGH SENSITIVITY): Troponin I (High Sensitivity): 5 ng/L (ref <18)

## 2022-06-01 NOTE — ED Notes (Signed)
Pt visitor to stat desk stating pt no longer wishes to be seen.  Ambulated out of ED

## 2022-06-01 NOTE — Telephone Encounter (Signed)
Called patient due to left emergency department before provider exam to inquire about condition and follow up plans. No answer and vm is full

## 2022-06-02 ENCOUNTER — Emergency Department
Admission: EM | Admit: 2022-06-02 | Discharge: 2022-06-02 | Discharge status: Against Medical Advice | Payer: 59 | Attending: Emergency Medicine | Admitting: Emergency Medicine

## 2022-06-02 DIAGNOSIS — Z5321 Procedure and treatment not carried out due to patient leaving prior to being seen by health care provider: Secondary | ICD-10-CM | POA: Insufficient documentation

## 2022-06-02 DIAGNOSIS — R079 Chest pain, unspecified: Secondary | ICD-10-CM | POA: Diagnosis not present

## 2022-06-02 NOTE — ED Triage Notes (Addendum)
Pt here for c/o chest pain reports has been on going for past 1 year. Pt reports  here for same complaints days ago. Reports CP is constant , take OTC tylenol but afforded no relief. Pt denies fever/ SOB/ dizziness at this time. Pt presents to ED AAOx4, respi even-unlabored

## 2022-08-15 ENCOUNTER — Encounter: Payer: Self-pay | Admitting: Emergency Medicine

## 2022-08-15 ENCOUNTER — Emergency Department: Payer: Self-pay

## 2022-08-15 ENCOUNTER — Emergency Department
Admission: EM | Admit: 2022-08-15 | Discharge: 2022-08-15 | Disposition: A | Discharge status: Home / Self Care | Payer: Self-pay | Attending: Emergency Medicine | Admitting: Emergency Medicine

## 2022-08-15 ENCOUNTER — Other Ambulatory Visit: Payer: Self-pay

## 2022-08-15 DIAGNOSIS — R1031 Right lower quadrant pain: Secondary | ICD-10-CM | POA: Insufficient documentation

## 2022-08-15 DIAGNOSIS — J45909 Unspecified asthma, uncomplicated: Secondary | ICD-10-CM | POA: Insufficient documentation

## 2022-08-15 DIAGNOSIS — R112 Nausea with vomiting, unspecified: Secondary | ICD-10-CM | POA: Insufficient documentation

## 2022-08-15 DIAGNOSIS — R1032 Left lower quadrant pain: Secondary | ICD-10-CM | POA: Insufficient documentation

## 2022-08-15 DIAGNOSIS — R197 Diarrhea, unspecified: Secondary | ICD-10-CM | POA: Insufficient documentation

## 2022-08-15 LAB — CBC
HCT: 46.2 % (ref 39.0–52.0)
Hemoglobin: 15.1 g/dL (ref 13.0–17.0)
MCH: 29.3 pg (ref 26.0–34.0)
MCHC: 32.7 g/dL (ref 30.0–36.0)
MCV: 89.5 fL (ref 80.0–100.0)
Platelets: 258 10*3/uL (ref 150–400)
RBC: 5.16 MIL/uL (ref 4.22–5.81)
RDW: 12.7 % (ref 11.5–15.5)
WBC: 5.1 10*3/uL (ref 4.0–10.5)
nRBC: 0 % (ref 0.0–0.2)

## 2022-08-15 LAB — URINALYSIS, ROUTINE W REFLEX MICROSCOPIC
Bacteria, UA: NONE SEEN
Bilirubin Urine: NEGATIVE
Glucose, UA: NEGATIVE mg/dL
Hgb urine dipstick: NEGATIVE
Ketones, ur: NEGATIVE mg/dL
Nitrite: NEGATIVE
Protein, ur: NEGATIVE mg/dL
Specific Gravity, Urine: 1.025 (ref 1.005–1.030)
pH: 5 (ref 5.0–8.0)

## 2022-08-15 LAB — COMPREHENSIVE METABOLIC PANEL
ALT: 100 U/L — ABNORMAL HIGH (ref 0–44)
AST: 47 U/L — ABNORMAL HIGH (ref 15–41)
Albumin: 4.1 g/dL (ref 3.5–5.0)
Alkaline Phosphatase: 114 U/L (ref 38–126)
Anion gap: 3 — ABNORMAL LOW (ref 5–15)
BUN: 10 mg/dL (ref 6–20)
CO2: 29 mmol/L (ref 22–32)
Calcium: 8.9 mg/dL (ref 8.9–10.3)
Chloride: 108 mmol/L (ref 98–111)
Creatinine, Ser: 1.04 mg/dL (ref 0.61–1.24)
GFR, Estimated: >60 mL/min (ref >60)
Glucose, Bld: 78 mg/dL (ref 70–99)
Potassium: 4.1 mmol/L (ref 3.5–5.1)
Sodium: 140 mmol/L (ref 135–145)
Total Bilirubin: 0.4 mg/dL (ref 0.3–1.2)
Total Protein: 7.7 g/dL (ref 6.5–8.1)

## 2022-08-15 LAB — LIPASE, BLOOD: Lipase: 31 U/L (ref 11–51)

## 2022-08-15 MED ORDER — IOHEXOL 300 MG/ML  SOLN
100.0000 mL | Freq: Once | INTRAMUSCULAR | Status: AC | PRN
  Administered 2022-08-15: 100 mL via INTRAVENOUS

## 2022-08-15 MED ORDER — LACTATED RINGERS IV BOLUS
1000.0000 mL | Freq: Once | INTRAVENOUS | Status: AC
  Administered 2022-08-15: 1000 mL via INTRAVENOUS

## 2022-08-15 MED ORDER — ONDANSETRON HCL 4 MG PO TABS: 4.0000 mg | ORAL_TABLET | Freq: Every day | ORAL | 0 refills | Status: DC | PRN

## 2022-08-15 MED ORDER — ONDANSETRON HCL 4 MG/2ML IJ SOLN
4.0000 mg | Freq: Once | INTRAMUSCULAR | Status: AC
  Administered 2022-08-15: 4 mg via INTRAVENOUS
  Filled 2022-08-15: qty 2

## 2022-08-15 MED ORDER — KETOROLAC TROMETHAMINE 15 MG/ML IJ SOLN
15.0000 mg | Freq: Once | INTRAMUSCULAR | Status: AC
  Administered 2022-08-15: 15 mg via INTRAVENOUS
  Filled 2022-08-15: qty 1

## 2022-08-15 NOTE — ED Triage Notes (Signed)
Pt arrived via POV with c/o vomiting and abd pain, pt states on his way to work today he started coughing and vomiting.

## 2022-08-15 NOTE — Discharge Instructions (Addendum)
Zofran as needed for nausea and vomiting.  If you have any new or worsening symptoms call your doctor right away or come back to the emergency department.

## 2022-08-15 NOTE — ED Provider Notes (Signed)
[facility] Provider Note    Event Date/Time   First MD Initiated Contact with Patient 08/15/22 [phone]     (approximate)   History   Abdominal Pain and Emesis   HPI  Nathan Sanford is a 31 y.o. male with past medical history of asthma who presents with abdominal pain and vomiting.  Symptoms started yesterday morning.  He says he has had more than he can count episodes of emesis.  Has not really been able to tolerate p.o.  Was going to work today and vomited in the car.  Has had some mild diarrhea also with lower abdominal pain says the nausea and vomiting is worse than the pain.  Denies fevers or chills denies any drug use specifically no marijuana.  Denies history of cyclic vomiting.      Past Medical History:  Diagnosis Date   Asthma     There are no problems to display for this patient.    Physical Exam  Triage Vital Signs: ED Triage Vitals  Enc Vitals Group     BP 08/15/22 0606 132/82     Pulse Rate 08/15/22 0606 74     Resp 08/15/22 0606 18     Temp 08/15/22 0606 98.5 F (36.9 C)     Temp Source 08/15/22 0606 Oral     SpO2 08/15/22 0606 98 %     Weight 08/15/22 0607 140 lb (63.5 kg)     Height 08/15/22 0607 5\' 8"  (1.727 m)     Head Circumference --      Peak Flow --      Pain Score 08/15/22 0610 8     Pain Loc --      Pain Edu? --      Excl. in GC? --     Most recent vital signs: Vitals:   08/15/22 0606  BP: 132/82  Pulse: 74  Resp: 18  Temp: 98.5 F (36.9 C)  SpO2: 98%     General: Awake, no distress.  CV:  Good peripheral perfusion.  Resp:  Normal effort.  Abd:  No distention.  Abdomen is tender in bilateral lower quadrants, voluntary guarding in the right lower quadrant Neuro:             Awake, Alert, Oriented x 3  Other:     ED Results / Procedures / Treatments  Labs (all labs ordered are listed, but only abnormal results are displayed) Labs Reviewed  CBC  LIPASE, BLOOD  COMPREHENSIVE METABOLIC PANEL   URINALYSIS, ROUTINE W REFLEX MICROSCOPIC     EKG     RADIOLOGY Pending CT abdomen pelvis   PROCEDURES:  Critical Care performed: No  .1-3 Lead EKG Interpretation  Performed by: 08/17/22, MD Authorized by: Georga Hacking, MD     Interpretation: normal     ECG rate assessment: normal     Rhythm: sinus rhythm     Ectopy: none     Conduction: normal     The patient is on the cardiac monitor to evaluate for evidence of arrhythmia and/or significant heart rate changes.   MEDICATIONS ORDERED IN ED: Medications  lactated ringers bolus 1,000 mL (has no administration in time range)  ondansetron (ZOFRAN) injection 4 mg (4 mg Intravenous Given 08/15/22 0639)  ketorolac (TORADOL) 15 MG/ML injection 15 mg (15 mg Intravenous Given 08/15/22 0640)     IMPRESSION / MDM / ASSESSMENT AND PLAN / ED COURSE  I reviewed the triage vital signs and  the nursing notes.                              Patient's presentation is most consistent with acute presentation with potential threat to life or bodily function.  Differential diagnosis includes, but is not limited to, acute appendicitis, gastroenteritis, cyclic vomiting syndrome, cannabinoid hyperemesis syndrome, pancreatitis, biliary colic  Patient is a 31 year old male presents with vomiting and abdominal pain since yesterday.  Nausea and vomiting seem to be more severe than the pain itself.  Has had more than he can count episodes.  Nonbilious nonbloody.  Denies drug or alcohol use.  On exam he overall looks well not actively vomiting.  He is tender in the bilateral lower quadrants with some guarding in the right lower quadrant.  Based on his story my suspicion for appendicitis is low but given his exam will obtain a CT abdomen pelvis with contrast.  We will give a liter of fluid IV Zofran and check basic labs including CBC CMP and lipase.  Patient is signed out to oncoming provider pending labs and CT imaging.      FINAL  CLINICAL IMPRESSION(S) / ED DIAGNOSES   Final diagnoses:  Nausea and vomiting, unspecified vomiting type     Rx / DC Orders   ED Discharge Orders     None        Note:  This document was prepared using Dragon voice recognition software and may include unintentional dictation errors.   Georga Hacking, MD 08/15/22 [phone]

## 2022-09-18 ENCOUNTER — Emergency Department
Admission: EM | Admit: 2022-09-18 | Discharge: 2022-09-18 | Disposition: A | Discharge status: Home / Self Care | Payer: Self-pay | Attending: Student in an Organized Health Care Education/Training Program | Admitting: Student in an Organized Health Care Education/Training Program

## 2022-09-18 ENCOUNTER — Encounter: Payer: Self-pay | Admitting: Emergency Medicine

## 2022-09-18 ENCOUNTER — Other Ambulatory Visit: Payer: Self-pay

## 2022-09-18 DIAGNOSIS — R109 Unspecified abdominal pain: Secondary | ICD-10-CM | POA: Insufficient documentation

## 2022-09-18 LAB — BASIC METABOLIC PANEL
Anion gap: 3 — ABNORMAL LOW (ref 5–15)
BUN: 8 mg/dL (ref 6–20)
CO2: 29 mmol/L (ref 22–32)
Calcium: 9 mg/dL (ref 8.9–10.3)
Chloride: 106 mmol/L (ref 98–111)
Creatinine, Ser: 0.98 mg/dL (ref 0.61–1.24)
GFR, Estimated: >60 mL/min (ref >60)
Glucose, Bld: 93 mg/dL (ref 70–99)
Potassium: 3.6 mmol/L (ref 3.5–5.1)
Sodium: 138 mmol/L (ref 135–145)

## 2022-09-18 LAB — URINALYSIS, ROUTINE W REFLEX MICROSCOPIC
Bilirubin Urine: NEGATIVE
Glucose, UA: NEGATIVE mg/dL
Hgb urine dipstick: NEGATIVE
Ketones, ur: NEGATIVE mg/dL
Leukocytes,Ua: NEGATIVE
Nitrite: NEGATIVE
Protein, ur: NEGATIVE mg/dL
Specific Gravity, Urine: 1.002 — ABNORMAL LOW (ref 1.005–1.030)
pH: 8 (ref 5.0–8.0)

## 2022-09-18 LAB — CBC
HCT: 42.8 % (ref 39.0–52.0)
Hemoglobin: 14.2 g/dL (ref 13.0–17.0)
MCH: 29.3 pg (ref 26.0–34.0)
MCHC: 33.2 g/dL (ref 30.0–36.0)
MCV: 88.2 fL (ref 80.0–100.0)
Platelets: 260 10*3/uL (ref 150–400)
RBC: 4.85 MIL/uL (ref 4.22–5.81)
RDW: 12.9 % (ref 11.5–15.5)
WBC: 4.9 10*3/uL (ref 4.0–10.5)
nRBC: 0 % (ref 0.0–0.2)

## 2022-09-18 MED ORDER — CYCLOBENZAPRINE HCL 10 MG PO TABS: 10.0000 mg | ORAL_TABLET | Freq: Three times a day (TID) | ORAL | 0 refills | Status: DC | PRN

## 2022-09-18 MED ORDER — KETOROLAC TROMETHAMINE 30 MG/ML IJ SOLN
30.0000 mg | Freq: Once | INTRAMUSCULAR | Status: AC
Start: 2022-09-18 — End: 2022-09-18
  Administered 2022-09-18: 30 mg via INTRAMUSCULAR
  Filled 2022-09-18: qty 1

## 2022-09-18 NOTE — ED Provider Notes (Signed)
[facility] Provider Note    Event Date/Time   First MD Initiated Contact with Patient 09/18/22 0820     (approximate)   History   No chief complaint on file.   HPI  Nathan Sanford is a 31 y.o. male no significant past medical history presents to the ER for evaluation of right sided flank pain worsened with movement.  States he woke up with the symptoms last night and thinks he slept on it wrong.  Went to work today but was unable to stay due to discomfort.  Denies any trauma or heavy lifting.  No numbness or tingling.  Pain does not radiate into his groin or testicle.  No abdominal pain no nausea or vomiting no fevers or chills.  Denies any dysuria or hematuria no history of kidney stones.     Physical Exam   Triage Vital Signs: ED Triage Vitals  Enc Vitals Group     BP 09/18/22 0751 121/76     Pulse Rate 09/18/22 0751 70     Resp 09/18/22 0751 18     Temp 09/18/22 0751 98.2 F (36.8 C)     Temp Source 09/18/22 0751 Oral     SpO2 09/18/22 0751 98 %     Weight 09/18/22 0733 139 lb 15.9 oz (63.5 kg)     Height 09/18/22 0733 5\' 8"  (1.727 m)     Head Circumference --      Peak Flow --      Pain Score 09/18/22 0733 6     Pain Loc --      Pain Edu? --      Excl. in [place]? --     Most recent vital signs: Vitals:   09/18/22 0751  BP: 121/76  Pulse: 70  Resp: 18  Temp: 98.2 F (36.8 C)  SpO2: 98%     Constitutional: Alert  Eyes: Conjunctivae are normal.  Head: Atraumatic. Nose: No congestion/rhinnorhea. Mouth/Throat: Mucous membranes are moist.   Neck: Painless ROM.  Cardiovascular:   Good peripheral circulation. Respiratory: Normal respiratory effort.  No retractions.  Gastrointestinal: Soft and nontender.  Musculoskeletal:  no deformity.  Palpation of the right low back.  No overlying rash erythema or warmth. Neurologic:  MAE spontaneously. No gross focal neurologic deficits are appreciated.  Skin:  Skin is warm, dry and intact. No rash  noted. Psychiatric: Mood and affect are normal. Speech and behavior are normal.    ED Results / Procedures / Treatments   Labs (all labs ordered are listed, but only abnormal results are displayed) Labs Reviewed  URINALYSIS, ROUTINE W REFLEX MICROSCOPIC - Abnormal; Notable for the following components:      Result Value   Color, Urine STRAW (*)    APPearance CLEAR (*)    Specific Gravity, Urine 1.002 (*)    All other components within normal limits  BASIC METABOLIC PANEL - Abnormal; Notable for the following components:   Anion gap 3 (*)    All other components within normal limits  CBC     EKG     RADIOLOGY Please see ED Course for my review and interpretation.  I personally reviewed all radiographic images ordered to evaluate for the above acute complaints and reviewed radiology reports and findings.  These findings were personally discussed with the patient.  Please see medical record for radiology report.    PROCEDURES:  Critical Care performed: No  Procedures   MEDICATIONS ORDERED IN ED: Medications  ketorolac (TORADOL) 30  MG/ML injection 30 mg (has no administration in time range)     IMPRESSION / MDM / ASSESSMENT AND PLAN / ED COURSE  I reviewed the triage vital signs and the nursing notes.                              Differential diagnosis includes, but is not limited to, muscle spasm, kidney stone, pyelo-, appendicitis, diverticulitis, colitis, torsion, equina, spinal stenosis  Patient presented to the ER for evaluation of symptoms as described above.  Patient nontoxic-appearing in no acute distress.  Blood work without any leukocytosis or acute abnormality.  He is not febrile he is well-perfused not tachycardic.  His abdominal exam is benign.  Considered kidney stone but no blood in his urine and this seems very reproducible constant more consistent with musculoskeletal source of discomfort.  Probable spasm states these occur for him from time to  time.  Do not feel that further diagnostic imaging clinically indicated at this time.  Does appear appropriate for trial of outpatient management.      FINAL CLINICAL IMPRESSION(S) / ED DIAGNOSES   Final diagnoses:  Right flank pain     Rx / DC Orders   ED Discharge Orders          Ordered    cyclobenzaprine (FLEXERIL) 10 MG tablet  3 times daily PRN        09/18/22 0829             Note:  This document was prepared using Dragon voice recognition software and may include unintentional dictation errors.    Merlyn Lot, MD 09/18/22 0830

## 2022-09-18 NOTE — ED Triage Notes (Signed)
C/O right lower back and flank pain since Saturday.  Denies injury.  AAOx3.  Skin warm and dry. NAD

## 2022-11-20 ENCOUNTER — Other Ambulatory Visit: Payer: Self-pay

## 2022-11-20 ENCOUNTER — Emergency Department
Admission: EM | Admit: 2022-11-20 | Discharge: 2022-11-20 | Disposition: A | Discharge status: Home / Self Care | Payer: Self-pay | Attending: Emergency Medicine | Admitting: Emergency Medicine

## 2022-11-20 ENCOUNTER — Encounter: Payer: Self-pay | Admitting: Emergency Medicine

## 2022-11-20 DIAGNOSIS — J45909 Unspecified asthma, uncomplicated: Secondary | ICD-10-CM | POA: Insufficient documentation

## 2022-11-20 DIAGNOSIS — M792 Neuralgia and neuritis, unspecified: Secondary | ICD-10-CM

## 2022-11-20 DIAGNOSIS — M25512 Pain in left shoulder: Secondary | ICD-10-CM

## 2022-11-20 DIAGNOSIS — G629 Polyneuropathy, unspecified: Secondary | ICD-10-CM | POA: Insufficient documentation

## 2022-11-20 MED ORDER — PREDNISONE 10 MG PO TABS: 10.0000 mg | ORAL_TABLET | Freq: Every day | ORAL | 0 refills | Status: DC

## 2022-11-20 NOTE — ED Triage Notes (Signed)
Presents with left shoulder pain  describes pain as burning  Denies any fall   no deformity noted

## 2022-11-20 NOTE — ED Provider Notes (Signed)
   [facility] Provider Note    Event Date/Time   First MD Initiated Contact with Patient 11/20/22 [phone]     (approximate)  History   Chief Complaint: Shoulder Pain  HPI  Nathan Sanford is a 31 y.o. male with a past medical history of asthma who presents to the emergency department for 2 months of burning sensation to his left shoulder.  According to the patient for the past 2 months he has had sensation of burning left shoulder, somewhat worse with movement.  Patient has not noticed any rash to the area.  Denies any fever.  Denies any pain or numbness going down the arm.  Denies any headache.  Denies any known trauma to the area.  Physical Exam   Triage Vital Signs: ED Triage Vitals  Enc Vitals Group     BP 11/20/22 0915 116/71     Pulse Rate 11/20/22 0915 62     Resp 11/20/22 0915 17     Temp 11/20/22 0915 98.4 F (36.9 C)     Temp Source 11/20/22 0915 Oral     SpO2 11/20/22 0915 99 %     Weight 11/20/22 0913 141 lb 1.5 oz (64 kg)     Height 11/20/22 0913 5\' 8"  (1.727 m)     Head Circumference --      Peak Flow --      Pain Score 11/20/22 0912 8     Pain Loc --      Pain Edu? --      Excl. in GC? --     Most recent vital signs: Vitals:   11/20/22 0915  BP: 116/71  Pulse: 62  Resp: 17  Temp: 98.4 F (36.9 C)  SpO2: 99%    General: Awake, no distress.  CV:  Good peripheral perfusion.  Regular rate and rhythm  Resp:  Normal effort.  Equal breath sounds bilaterally.  Abd:  No distention.   Other:  No rash noted to the shoulder.  No tenderness to palpation.  Good range of motion in the shoulder with no significant pain elicited.  Neurovascular intact distally with 2+ radial pulse.   ED Results / Procedures / Treatments   MEDICATIONS ORDERED IN ED: Medications - No data to display   IMPRESSION / MDM / ASSESSMENT AND PLAN / ED COURSE  I reviewed the triage vital signs and the nursing notes.  Patient's presentation is most consistent  with acute presentation with potential threat to life or bodily function.  Patient presents emergency department for 2 months of burning sensation in his left shoulder, somewhat worse he states with movement of the left arm.  On examination patient has good grip strength, 2+ radial pulse, denies any pain shooting down the arm.  Patient does state somewhat worse with neck movement as well.  Symptoms more suggestive of radicular or neuropathic pain.  No rash to suggest shingles.  Discussed with the patient a trial of steroids as well as orthopedic follow-up given 2 months of symptoms.  Patient agreeable to plan of care.  FINAL CLINICAL IMPRESSION(S) / ED DIAGNOSES   Neuropathic pain  Rx / DC Orders   Prednisone taper  Note:  This document was prepared using Dragon voice recognition software and may include unintentional dictation errors.   11/22/22, MD 11/20/22 [phone]

## 2022-12-01 ENCOUNTER — Other Ambulatory Visit: Payer: Self-pay

## 2022-12-01 DIAGNOSIS — M25551 Pain in right hip: Secondary | ICD-10-CM | POA: Insufficient documentation

## 2022-12-01 DIAGNOSIS — Z5321 Procedure and treatment not carried out due to patient leaving prior to being seen by health care provider: Secondary | ICD-10-CM | POA: Insufficient documentation

## 2022-12-01 DIAGNOSIS — M545 Low back pain, unspecified: Secondary | ICD-10-CM | POA: Insufficient documentation
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
## 2022-12-02 ENCOUNTER — Emergency Department
Admission: EM | Admit: 2022-12-02 | Discharge: 2022-12-02 | Discharge status: Against Medical Advice | Payer: Self-pay | Attending: Emergency Medicine | Admitting: Emergency Medicine

## 2022-12-02 NOTE — ED Notes (Signed)
Pt not visualized nor answered when called from the lobby. This RN attempted to contact the patients phone number listed without success.

## 2022-12-02 NOTE — ED Notes (Signed)
Pt not visualized nor answered when called from the lobby. This RN attempted to contact the patients phone number listed without success.  

## 2023-04-27 ENCOUNTER — Emergency Department
Admission: EM | Admit: 2023-04-27 | Discharge: 2023-04-27 | Disposition: A | Discharge status: Home / Self Care | Payer: Managed Care, Other (non HMO) | Attending: Emergency Medicine | Admitting: Emergency Medicine

## 2023-04-27 ENCOUNTER — Other Ambulatory Visit: Payer: Self-pay

## 2023-04-27 ENCOUNTER — Encounter: Payer: Self-pay | Admitting: Intensive Care

## 2023-04-27 ENCOUNTER — Emergency Department: Payer: Managed Care, Other (non HMO)

## 2023-04-27 DIAGNOSIS — K920 Hematemesis: Secondary | ICD-10-CM | POA: Diagnosis present

## 2023-04-27 DIAGNOSIS — K279 Peptic ulcer, site unspecified, unspecified as acute or chronic, without hemorrhage or perforation: Secondary | ICD-10-CM | POA: Diagnosis not present

## 2023-04-27 DIAGNOSIS — J45909 Unspecified asthma, uncomplicated: Secondary | ICD-10-CM | POA: Insufficient documentation

## 2023-04-27 LAB — COMPREHENSIVE METABOLIC PANEL
ALT: 22 U/L (ref 0–44)
AST: 20 U/L (ref 15–41)
Albumin: 4 g/dL (ref 3.5–5.0)
Alkaline Phosphatase: 60 U/L (ref 38–126)
Anion gap: 8 (ref 5–15)
BUN: 9 mg/dL (ref 6–20)
CO2: 29 mmol/L (ref 22–32)
Calcium: 9 mg/dL (ref 8.9–10.3)
Chloride: 103 mmol/L (ref 98–111)
Creatinine, Ser: 1.03 mg/dL (ref 0.61–1.24)
GFR, Estimated: >60 mL/min (ref >60)
Glucose, Bld: 96 mg/dL (ref 70–99)
Potassium: 3.7 mmol/L (ref 3.5–5.1)
Sodium: 140 mmol/L (ref 135–145)
Total Bilirubin: 0.3 mg/dL (ref 0.3–1.2)
Total Protein: 7 g/dL (ref 6.5–8.1)

## 2023-04-27 LAB — CBC WITH DIFFERENTIAL/PLATELET
Abs Immature Granulocytes: 0.01 10*3/uL (ref 0.00–0.07)
Basophils Absolute: 0 10*3/uL (ref 0.0–0.1)
Basophils Relative: 1 %
Eosinophils Absolute: 0.2 10*3/uL (ref 0.0–0.5)
Eosinophils Relative: 4 %
HCT: 45.4 % (ref 39.0–52.0)
Hemoglobin: 14.8 g/dL (ref 13.0–17.0)
Immature Granulocytes: 0 %
Lymphocytes Relative: 42 %
Lymphs Abs: 2.4 10*3/uL (ref 0.7–4.0)
MCH: 29.4 pg (ref 26.0–34.0)
MCHC: 32.6 g/dL (ref 30.0–36.0)
MCV: 90.1 fL (ref 80.0–100.0)
Monocytes Absolute: 0.7 10*3/uL (ref 0.1–1.0)
Monocytes Relative: 12 %
Neutro Abs: 2.3 10*3/uL (ref 1.7–7.7)
Neutrophils Relative %: 41 %
Platelets: 238 10*3/uL (ref 150–400)
RBC: 5.04 MIL/uL (ref 4.22–5.81)
RDW: 12.7 % (ref 11.5–15.5)
WBC: 5.7 10*3/uL (ref 4.0–10.5)
nRBC: 0 % (ref 0.0–0.2)

## 2023-04-27 MED ORDER — PANTOPRAZOLE SODIUM 40 MG PO TBEC: 40.0000 mg | DELAYED_RELEASE_TABLET | Freq: Every day | ORAL | 1 refills | Status: DC

## 2023-04-27 MED ORDER — PANTOPRAZOLE SODIUM 40 MG IV SOLR
40.0000 mg | Freq: Once | INTRAVENOUS | Status: AC
  Administered 2023-04-27: 40 mg via INTRAVENOUS
  Filled 2023-04-27: qty 10

## 2023-04-27 MED ORDER — SODIUM CHLORIDE 0.9 % IV BOLUS
1000.0000 mL | Freq: Once | INTRAVENOUS | Status: AC
  Administered 2023-04-27: 1000 mL via INTRAVENOUS

## 2023-04-27 NOTE — ED Notes (Signed)
AVS with prescriptions provided to and discussed with patient. Pt verbalizes understanding of discharge instructions and denies any questions or concerns at this time. Pt ambulated out of department independently with steady gait. ? ?

## 2023-04-27 NOTE — ED Triage Notes (Signed)
Patient reports streaks of blood in his vomit X3 days. Reports "lung pain" with his cough
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
## 2023-05-28 ENCOUNTER — Emergency Department
Admission: EM | Admit: 2023-05-28 | Discharge: 2023-05-28 | Disposition: A | Discharge status: Home / Self Care | Payer: Managed Care, Other (non HMO) | Attending: Emergency Medicine | Admitting: Emergency Medicine

## 2023-05-28 ENCOUNTER — Other Ambulatory Visit: Payer: Self-pay

## 2023-05-28 ENCOUNTER — Encounter: Payer: Self-pay | Admitting: Emergency Medicine

## 2023-05-28 DIAGNOSIS — R1010 Upper abdominal pain, unspecified: Secondary | ICD-10-CM | POA: Insufficient documentation

## 2023-05-28 LAB — CBC WITH DIFFERENTIAL/PLATELET
Abs Immature Granulocytes: 0.01 10*3/uL (ref 0.00–0.07)
Basophils Absolute: 0.1 10*3/uL (ref 0.0–0.1)
Basophils Relative: 1 %
Eosinophils Absolute: 0.3 10*3/uL (ref 0.0–0.5)
Eosinophils Relative: 5 %
HCT: 45.3 % (ref 39.0–52.0)
Hemoglobin: 15 g/dL (ref 13.0–17.0)
Immature Granulocytes: 0 %
Lymphocytes Relative: 59 %
Lymphs Abs: 3.8 10*3/uL (ref 0.7–4.0)
MCH: 29.4 pg (ref 26.0–34.0)
MCHC: 33.1 g/dL (ref 30.0–36.0)
MCV: 88.6 fL (ref 80.0–100.0)
Monocytes Absolute: 0.4 10*3/uL (ref 0.1–1.0)
Monocytes Relative: 6 %
Neutro Abs: 1.8 10*3/uL (ref 1.7–7.7)
Neutrophils Relative %: 29 %
Platelets: 271 10*3/uL (ref 150–400)
RBC: 5.11 MIL/uL (ref 4.22–5.81)
RDW: 13.1 % (ref 11.5–15.5)
WBC: 6.3 10*3/uL (ref 4.0–10.5)
nRBC: 0 % (ref 0.0–0.2)

## 2023-05-28 LAB — COMPREHENSIVE METABOLIC PANEL
ALT: 18 U/L (ref 0–44)
AST: 19 U/L (ref 15–41)
Albumin: 4.7 g/dL (ref 3.5–5.0)
Alkaline Phosphatase: 56 U/L (ref 38–126)
Anion gap: 8 (ref 5–15)
BUN: 14 mg/dL (ref 6–20)
CO2: 27 mmol/L (ref 22–32)
Calcium: 9.3 mg/dL (ref 8.9–10.3)
Chloride: 102 mmol/L (ref 98–111)
Creatinine, Ser: 1.23 mg/dL (ref 0.61–1.24)
GFR, Estimated: >60 mL/min (ref >60)
Glucose, Bld: 96 mg/dL (ref 70–99)
Potassium: 3.6 mmol/L (ref 3.5–5.1)
Sodium: 137 mmol/L (ref 135–145)
Total Bilirubin: 0.6 mg/dL (ref 0.3–1.2)
Total Protein: 7.9 g/dL (ref 6.5–8.1)

## 2023-05-28 LAB — URINALYSIS, ROUTINE W REFLEX MICROSCOPIC
Bilirubin Urine: NEGATIVE
Glucose, UA: NEGATIVE mg/dL
Hgb urine dipstick: NEGATIVE
Ketones, ur: NEGATIVE mg/dL
Leukocytes,Ua: NEGATIVE
Nitrite: NEGATIVE
Protein, ur: NEGATIVE mg/dL
Specific Gravity, Urine: 1.027 (ref 1.005–1.030)
pH: 5 (ref 5.0–8.0)

## 2023-05-28 LAB — LIPASE, BLOOD: Lipase: 29 U/L (ref 11–51)

## 2023-05-28 MED ORDER — ONDANSETRON HCL 4 MG PO TABS: 4.0000 mg | ORAL_TABLET | Freq: Four times a day (QID) | ORAL | 0 refills | Status: AC | PRN

## 2023-05-28 MED ORDER — PANTOPRAZOLE SODIUM 40 MG PO TBEC: 40.0000 mg | DELAYED_RELEASE_TABLET | Freq: Every day | ORAL | 1 refills | Status: AC

## 2023-05-28 NOTE — ED Provider Notes (Signed)
[facility] Provider Note    Event Date/Time   First MD Initiated Contact with Patient 05/28/23 2143     (approximate)   History   Abdominal Pain   HPI  Nathan Sanford is a 32 y.o. male presenting to the emergency department for evaluation of upper abdominal pain.  Patient was seen in our ER on 5/4 with abdominal pain.  At that time, it was suspected the patient had peptic ulcer disease.  He was discharged with a prescription for Protonix.  Patient reports that he thought that this was nausea medication and has been taking this as needed.  He has continued to have abdominal pain.  At that time, did have hematemesis.  Reports that his vomiting is resolved and he has not had any further blood in his vomit.  He denies any significant change in his abdominal pain, reports it has not improved.  No fevers, diarrhea, dysuria.     Physical Exam   Triage Vital Signs: ED Triage Vitals  Enc Vitals Group     BP 05/28/23 2135 130/74     Pulse Rate 05/28/23 2135 65     Resp 05/28/23 2135 18     Temp 05/28/23 2135 98.4 F (36.9 C)     Temp Source 05/28/23 2135 Oral     SpO2 05/28/23 2135 96 %     Weight 05/28/23 2127 145 lb (65.8 kg)     Height 05/28/23 2127 5\' 8"  (1.727 m)     Head Circumference --      Peak Flow --      Pain Score 05/28/23 2127 7     Pain Loc --      Pain Edu? --      Excl. in GC? --     Most recent vital signs: Vitals:   05/28/23 2135  BP: 130/74  Pulse: 65  Resp: 18  Temp: 98.4 F (36.9 C)  SpO2: 96%     General: Awake, interactive  CV:  Regular rate, good peripheral perfusion.  Resp:  Lungs clear, unlabored respirations.  Abd:  Soft, nondistended, mild tenderness to palpation throughout the upper abdomen without rebound or guarding Neuro:  Symmetric facial movement, fluid speech   ED Results / Procedures / Treatments   Labs (all labs ordered are listed, but only abnormal results are displayed) Labs Reviewed  URINALYSIS,  ROUTINE W REFLEX MICROSCOPIC - Abnormal; Notable for the following components:      Result Value   Color, Urine YELLOW (*)    APPearance CLEAR (*)    All other components within normal limits  CBC WITH DIFFERENTIAL/PLATELET  COMPREHENSIVE METABOLIC PANEL  LIPASE, BLOOD     EKG EKG independently reviewed interpreted by myself (ER attending) demonstrates:    RADIOLOGY Imaging independently reviewed and interpreted by myself demonstrates:    PROCEDURES:  Critical Care performed: No  Procedures   MEDICATIONS ORDERED IN ED: Medications - No data to display   IMPRESSION / MDM / ASSESSMENT AND PLAN / ED COURSE  I reviewed the triage vital signs and the nursing notes.  Differential diagnosis includes, but is not limited to, gastritis, peptic ulcer disease, low suspicion for biliary pathology, UTI, significant acute intra-abdominal process given overall reassuring exam and duration of symptoms  Patient's presentation is most consistent with acute presentation with potential threat to life or bodily function.  32 year old male presenting with ongoing upper abdominal pain.  His blood work and urine studies are overall reassuring.  Did  discuss appropriate use of his medication including taking Protonix daily not as needed.  Will DC with a prescription for Zofran as needed.  Patient is comfortable with discharge home.  Will follow-up with primary care doctor for further evaluation.  Strict return precautions provided.  Patient discharged stable condition.     FINAL CLINICAL IMPRESSION(S) / ED DIAGNOSES   Final diagnoses:  Upper abdominal pain     Rx / DC Orders   ED Discharge Orders          Ordered    ondansetron (ZOFRAN) 4 MG tablet  Every 6 hours PRN        05/28/23 2258    pantoprazole (PROTONIX) 40 MG tablet  Daily        05/28/23 2258             Note:  This document was prepared using Dragon voice recognition software and may include unintentional dictation  errors.   Trinna Post, MD 05/29/23 [phone]

## 2023-05-28 NOTE — Discharge Instructions (Addendum)
You were seen in the emergency room today for evaluation of your abdominal pain. Fortunately, your lab work and exam are overall reassuring.  I suspect that your pain may be related to irritation of your stomach lining or possibly a stomach ulcer.  Please avoid taking NSAIDs in the future such as ibuprofen and Aleve.  Please take the Protonix regularly to help protect your stomach lining.  I sent a prescription for nausea medicine called Zofran that you can take as needed.  Please follow with your primary care doctor for further evaluation.  If you have ongoing symptoms, they may refer you to a specialist for further evaluation.  Return to the ER for any new or worsening symptoms.

## 2023-05-28 NOTE — ED Triage Notes (Signed)
Patient ambulatory to triage with steady gait, without difficulty or distress noted; pt reports seen approx mo ago and dx with stomach ulcers, no meds rx; c/o persistent generalized and pain with no accomp symptoms

## 2024-02-28 ENCOUNTER — Emergency Department
Admission: EM | Admit: 2024-02-28 | Discharge: 2024-02-28 | Disposition: A | Discharge status: Home / Self Care | Attending: Emergency Medicine | Admitting: Emergency Medicine

## 2024-02-28 ENCOUNTER — Other Ambulatory Visit: Payer: Self-pay

## 2024-02-28 DIAGNOSIS — J45909 Unspecified asthma, uncomplicated: Secondary | ICD-10-CM | POA: Diagnosis not present

## 2024-02-28 DIAGNOSIS — M79602 Pain in left arm: Secondary | ICD-10-CM | POA: Diagnosis present

## 2024-02-28 DIAGNOSIS — G5622 Lesion of ulnar nerve, left upper limb: Secondary | ICD-10-CM | POA: Diagnosis not present

## 2024-02-28 MED ORDER — MELOXICAM 15 MG PO TABS: 15.0000 mg | ORAL_TABLET | Freq: Every day | ORAL | 0 refills | Status: AC

## 2024-02-28 NOTE — Discharge Instructions (Addendum)
 Rest and ice off an on throughout the next couple of days.  Take the meloxicam daily as prescribed.  Follow up with orthopedics or primary care if not improving over the week.

## 2024-02-28 NOTE — ED Provider Notes (Signed)
 [facility] Provider Note    Event Date/Time   First MD Initiated Contact with Patient 02/28/24 [phone]     (approximate)   History   Arm Pain   HPI  Nathan Sanford is a 33 y.o. male  with history of asthma and as listed in EMR presents to the emergency department for evaluation of left arm pain x 4 days. Patient works in [employer]. Pain started after "busting open a box" on Monday. Pain radiates from elbow to middle, ring, and pinky. No noted weakness in grip. Pain is worse with movement. No pain in upper arm or shoulder.      Physical Exam   Triage Vital Signs: ED Triage Vitals  Encounter Vitals Group     BP 02/28/24 0722 (!) 114/94     Systolic BP Percentile --      Diastolic BP Percentile --      Pulse Rate 02/28/24 0722 75     Resp 02/28/24 0722 16     Temp 02/28/24 0722 97.7 F (36.5 C)     Temp src --      SpO2 02/28/24 0722 99 %     Weight 02/28/24 0721 145 lb (65.8 kg)     Height 02/28/24 0721 5\' 8"  (1.727 m)     Head Circumference --      Peak Flow --      Pain Score 02/28/24 0721 9     Pain Loc --      Pain Education --      Exclude from Growth Chart --     Most recent vital signs: Vitals:   02/28/24 0722  BP: (!) 114/94  Pulse: 75  Resp: 16  Temp: 97.7 F (36.5 C)  SpO2: 99%    General: Awake, no distress.  CV:  Good peripheral perfusion.  Resp:  Normal effort.  Abd:  No distention.  Other:  No swelling over the bursa area of the left elbow. No wound or erythema. Pain starts in the cubital tunnel and radiates into left 3rd, 4th, and 5th digit. Grip strength is equal and strong.    ED Results / Procedures / Treatments   Labs (all labs ordered are listed, but only abnormal results are displayed) Labs Reviewed - No data to display   EKG  Not indicated.   RADIOLOGY  Image and radiology report reviewed and interpreted by me. Radiology report consistent with the same.  Not  indicaed.  PROCEDURES:  Critical Care performed: No  Procedures   MEDICATIONS ORDERED IN ED:  Medications - No data to display   IMPRESSION / MDM / ASSESSMENT AND PLAN / ED COURSE   I have reviewed the triage note.  Differential diagnosis includes, but is not limited to, ulnar nerve compression, medial nerve injury, repetitive use syndrome, bursitis  Patient's presentation is most consistent with acute illness / injury with system symptoms.  33 year old male presenting to the emergency department for treatment and evaluation of persistent left lower arm pain that started after busting a box open at work on Monday.  See HPI for further details.  Patient's exam is reassuring.  Grip strength is strong.  Pain follows the ulnar nerve.  Plan will be to have him take meloxicam and rest the arm.  He is to schedule follow-up appointment with orthopedics if the pain does not improve or gets worse.      FINAL CLINICAL IMPRESSION(S) / ED DIAGNOSES   Final diagnoses:  Irritation of left  ulnar nerve     Rx / DC Orders   ED Discharge Orders          Ordered    meloxicam (MOBIC) 15 MG tablet  Daily        02/28/24 0738             Note:  This document was prepared using Dragon voice recognition software and may include unintentional dictation errors.   Chinita Pester, FNP 02/28/24 1007    Delton Prairie, MD 02/28/24 [phone]

## 2024-02-28 NOTE — ED Triage Notes (Signed)
 Pt to ED for left arm pain since Monday. States started hurting while working in [employer]. Pain worsens with movement

## 2024-02-28 NOTE — ED Notes (Signed)
 See triage note  Presents wit some pain to left arm  States pain goes from elbow into hand  No swelling or deformity noted   Good pulses

## 2024-09-15 ENCOUNTER — Emergency Department

## 2024-09-15 ENCOUNTER — Other Ambulatory Visit: Payer: Self-pay

## 2024-09-15 ENCOUNTER — Emergency Department
Admission: EM | Admit: 2024-09-15 | Discharge: 2024-09-15 | Disposition: A | Discharge status: Home / Self Care | Attending: Emergency Medicine | Admitting: Emergency Medicine

## 2024-09-15 DIAGNOSIS — S39012A Strain of muscle, fascia and tendon of lower back, initial encounter: Secondary | ICD-10-CM | POA: Diagnosis not present

## 2024-09-15 DIAGNOSIS — S3992XA Unspecified injury of lower back, initial encounter: Secondary | ICD-10-CM | POA: Diagnosis present

## 2024-09-15 DIAGNOSIS — X509XXA Other and unspecified overexertion or strenuous movements or postures, initial encounter: Secondary | ICD-10-CM | POA: Diagnosis not present

## 2024-09-15 MED ORDER — NAPROXEN 500 MG PO TABS: 500.0000 mg | ORAL_TABLET | Freq: Two times a day (BID) | ORAL | 0 refills | Status: AC

## 2024-09-15 MED ORDER — METHOCARBAMOL 500 MG PO TABS: 500.0000 mg | ORAL_TABLET | Freq: Four times a day (QID) | ORAL | 0 refills | Status: AC | PRN

## 2024-09-15 NOTE — ED Triage Notes (Signed)
 Pt to ED via POV from home. Pt reports lower back pain since Saturday. Pt reports radiation to left leg.

## 2024-09-15 NOTE — ED Provider Notes (Addendum)
 [facility] Provider Note    Event Date/Time   First MD Initiated Contact with Patient 09/15/24 [phone]     (approximate)   History   Back Pain   HPI  Nathan Sanford is a 33 y.o. male with no significant past medical history who comes ED complaining of left lower back pain radiating down the left leg for the past 3 days.  Started suddenly when he was lifting his son while also trying to tie his shoe in a bent over position, felt a sudden pop in the lower back with pain.  No paresthesias or motor weakness.  No difficulty with urinating or bowel function.  No fever.        Physical Exam   Triage Vital Signs: ED Triage Vitals  Encounter Vitals Group     BP 09/15/24 0802 116/80     Girls Systolic BP Percentile --      Girls Diastolic BP Percentile --      Boys Systolic BP Percentile --      Boys Diastolic BP Percentile --      Pulse Rate 09/15/24 0800 72     Resp 09/15/24 0800 18     Temp 09/15/24 0800 98.4 F (36.9 C)     Temp Source 09/15/24 0800 Oral     SpO2 09/15/24 0800 98 %     Weight --      Height --      Head Circumference --      Peak Flow --      Pain Score 09/15/24 0759 9     Pain Loc --      Pain Education --      Exclude from Growth Chart --     Most recent vital signs: Vitals:   09/15/24 0800 09/15/24 0802  BP:  116/80  Pulse: 72   Resp: 18   Temp: 98.4 F (36.9 C)   SpO2: 98%     General: Awake, no distress.  CV:  Good peripheral perfusion.  Resp:  Normal effort.  Abd:  No distention.  Other:  No midline spinal tenderness.  There is exquisite tenderness in the left lower back quadratus lumborum reproducing his pain.  Intact motor function, EHL normal.  Ambulatory   ED Results / Procedures / Treatments   Labs (all labs ordered are listed, but only abnormal results are displayed) Labs Reviewed - No data to display   RADIOLOGY X-ray lumbar spine normal   PROCEDURES:  Procedures   MEDICATIONS ORDERED IN  ED: Medications - No data to display   IMPRESSION / MDM / ASSESSMENT AND PLAN / ED COURSE  I reviewed the triage vital signs and the nursing notes.                              Differential diagnosis includes, but is not limited to, lumbar strain, lumbar herniated disc, muscle spasm, lumbar compression fracture  Presents with left low back pain, sudden onset while in a bent over position, exam consistent with a muscle strain.  Will treat with supportive care, NSAIDs, heat therapy, Robaxin , work note provided      FINAL CLINICAL IMPRESSION(S) / ED DIAGNOSES   Final diagnoses:  Strain of lumbar region, initial encounter     Rx / DC Orders   ED Discharge Orders          Ordered    naproxen  (NAPROSYN ) 500 MG tablet  2 times daily with meals        09/15/24 0838    methocarbamol  (ROBAXIN ) 500 MG tablet  Every 6 hours PRN        09/15/24 9161             Note:  This document was prepared using Dragon voice recognition software and may include unintentional dictation errors.   Viviann Pastor, MD 09/15/24 9155    Viviann Pastor, MD 09/15/24 [phone]
